# Patient Record
Sex: Male | Born: 1985 | Race: White | Hispanic: No | Marital: Single | Smoking: Never smoker
Health system: Southern US, Community
[De-identification: ages and names within clinical notes are randomized; demographics above are authoritative.]

## PROBLEM LIST (undated history)

## (undated) DIAGNOSIS — IMO0001 Reserved for inherently not codable concepts without codable children: Secondary | ICD-10-CM

## (undated) DIAGNOSIS — J45909 Unspecified asthma, uncomplicated: Secondary | ICD-10-CM

## (undated) HISTORY — DX: Unspecified asthma, uncomplicated: J45.909

## (undated) HISTORY — DX: Reserved for inherently not codable concepts without codable children: IMO0001

---
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
## 2005-03-17 ENCOUNTER — Ambulatory Visit (HOSPITAL_COMMUNITY): Admission: RE | Admit: 2005-03-17 | Discharge: 2005-03-17 | Payer: Self-pay | Admitting: Family Medicine

## 2012-12-03 ENCOUNTER — Encounter: Payer: Self-pay | Admitting: *Deleted

## 2012-12-07 ENCOUNTER — Encounter: Payer: Self-pay | Admitting: Nurse Practitioner

## 2012-12-07 ENCOUNTER — Ambulatory Visit (INDEPENDENT_AMBULATORY_CARE_PROVIDER_SITE_OTHER): Payer: BC Managed Care – PPO | Admitting: Nurse Practitioner

## 2012-12-07 VITALS — BP 122/80 | Temp 98.3°F | Wt 210.2 lb

## 2012-12-07 DIAGNOSIS — J209 Acute bronchitis, unspecified: Secondary | ICD-10-CM

## 2012-12-07 DIAGNOSIS — M722 Plantar fascial fibromatosis: Secondary | ICD-10-CM

## 2012-12-07 DIAGNOSIS — J069 Acute upper respiratory infection, unspecified: Secondary | ICD-10-CM

## 2012-12-07 MED ORDER — AZITHROMYCIN 250 MG PO TABS: ORAL_TABLET | ORAL | Status: DC

## 2012-12-07 MED ORDER — HYDROCODONE-HOMATROPINE 5-1.5 MG/5ML PO SYRP: 5.0000 mL | ORAL_SOLUTION | ORAL | Status: DC | PRN

## 2012-12-07 MED ORDER — PREDNISONE 20 MG PO TABS: ORAL_TABLET | ORAL | Status: DC

## 2012-12-07 MED ORDER — ALBUTEROL SULFATE HFA 108 (90 BASE) MCG/ACT IN AERS: 2.0000 | INHALATION_SPRAY | RESPIRATORY_TRACT | Status: DC | PRN

## 2012-12-07 NOTE — Patient Instructions (Signed)

## 2012-12-08 ENCOUNTER — Encounter: Payer: Self-pay | Admitting: Nurse Practitioner

## 2012-12-08 ENCOUNTER — Ambulatory Visit: Payer: Self-pay | Admitting: Nurse Practitioner

## 2012-12-08 DIAGNOSIS — J45909 Unspecified asthma, uncomplicated: Secondary | ICD-10-CM | POA: Insufficient documentation

## 2012-12-08 NOTE — Assessment & Plan Note (Signed)
Refill  Ventolin inhaler

## 2012-12-08 NOTE — Progress Notes (Signed)
Subjective:  Presents complaints of cough and congestion that began 2 weeks ago. Symptoms had improved until 2 days ago. Cough began worsening again producing color with his sputum. No fever. Headache and runny nose have improved. Began having wheezing yesterday. Using his inhaler at least to 3 times per day. Some relief of his wheezing. Asthma has been stable until this time. Sore throat. No ear pain. No vomiting diarrhea or abdominal pain. No acid reflux symptoms. Also complaints of pain in the right heel area going along the sole of the foot on the lateral side for the past 2 weeks. No specific history of injury. More intense when he first gets up in the morning and 21st and after sitting for while. Will occasionally radiate into the Achilles area.  Objective:   BP 122/80  Temp(Src) 98.3 F (36.8 C) (Oral)  Wt 210 lb 4 oz (95.369 kg) NAD. Alert, oriented. TMs mild clear effusion, no erythema. Pharynx erythematous with PND noted. Neck supple with mild soft slightly tender adenopathy. Lungs scattered expiratory crackles noted throughout lung fields. Faint intermittent expiratory wheeze noted upper lobe right side. No tachypnea. Normal color. Heart regular rate rhythm. Right foot mild tenderness noted with palpation of the heel area into the arch of the foot. Good ROM of the right ankle without tenderness. No tenderness with palpation of the Achilles tendon. Complaints of extreme tenderness when patient tries to do weightbearing after sitting during his visit.  Assessment: Acute upper respiratory infection  Acute bronchitis  Plantar fasciitis of right foot  plan: Given written and verbal information on plantar fasciitis. Prescription for prednisone 20 mg nine-day taper in case it is needed. Also Hycodan syrup as directed if needed. Z-Pak as directed. Refill Ventolin HFA inhaler. Call back if symptoms worsen or persist. Recommend heel injection if no improvement in pain.

## 2012-12-09 ENCOUNTER — Other Ambulatory Visit: Payer: Self-pay | Admitting: Nurse Practitioner

## 2013-03-27 ENCOUNTER — Encounter: Payer: Self-pay | Admitting: Family Medicine

## 2013-03-27 ENCOUNTER — Ambulatory Visit (INDEPENDENT_AMBULATORY_CARE_PROVIDER_SITE_OTHER): Payer: No Typology Code available for payment source | Admitting: Family Medicine

## 2013-03-27 VITALS — BP 134/88 | Temp 98.9°F | Ht 67.0 in | Wt 212.4 lb

## 2013-03-27 DIAGNOSIS — J029 Acute pharyngitis, unspecified: Secondary | ICD-10-CM

## 2013-03-27 MED ORDER — AZITHROMYCIN 250 MG PO TABS: ORAL_TABLET | ORAL | Status: DC

## 2013-03-27 NOTE — Progress Notes (Signed)
  Subjective:    Patient ID: Mike Harvey, male    DOB: [birth year], 27 y.o.   MRN: [id number]  Sore Throat  This is a new problem. The current episode started today. There has been no fever. Pertinent negatives include no congestion or coughing. Associated symptoms comments: Body aches . He has tried nothing for the symptoms.   Patient relates this is all hit him in the past couple days throat very severe feels warm at times.   Review of Systems  Constitutional: Positive for fatigue. Negative for fever and appetite change.  HENT: Positive for sore throat. Negative for congestion.   Respiratory: Negative for cough and chest tightness.   Cardiovascular: Negative for chest pain.       Objective:   Physical Exam  Vitals reviewed. Constitutional: He appears well-developed and well-nourished.  HENT:  Head: Normocephalic and atraumatic.  Cardiovascular: Normal rate, regular rhythm and normal heart sounds.   No murmur heard. Pulmonary/Chest: Effort normal and breath sounds normal.  Lymphadenopathy:    He has no cervical adenopathy.          Assessment & Plan:  Pharyngitis- rapid strep, no sign of any type of pneumonia asthma or sinusitis.

## 2013-03-28 LAB — STREP A DNA PROBE: GASP: NEGATIVE

## 2013-04-25 ENCOUNTER — Encounter: Payer: Self-pay | Admitting: Family Medicine

## 2013-04-25 ENCOUNTER — Ambulatory Visit (INDEPENDENT_AMBULATORY_CARE_PROVIDER_SITE_OTHER): Payer: No Typology Code available for payment source | Admitting: Family Medicine

## 2013-04-25 VITALS — BP 136/90 | Temp 98.3°F | Ht 67.0 in | Wt 212.0 lb

## 2013-04-25 DIAGNOSIS — L739 Follicular disorder, unspecified: Secondary | ICD-10-CM

## 2013-04-25 DIAGNOSIS — L738 Other specified follicular disorders: Secondary | ICD-10-CM

## 2013-04-25 MED ORDER — TRIAMCINOLONE ACETONIDE 0.1 % EX CREA: TOPICAL_CREAM | Freq: Three times a day (TID) | CUTANEOUS | Status: DC

## 2013-04-25 MED ORDER — DOXYCYCLINE HYCLATE 100 MG PO CAPS: 100.0000 mg | ORAL_CAPSULE | Freq: Two times a day (BID) | ORAL | Status: DC

## 2013-04-25 NOTE — Progress Notes (Signed)
  Subjective:    Patient ID: Mike Harvey, male    DOB: Jul 22, 1985, 27 y.o.   MRN: [id number]  HPIItchy Rash on stomach. Started 3 days ago. Used steroid cream with no relief. There were a few bumps it was somewhat tender no pus from it. No pustules. No blisters. They grouped right around the right abdomen. He denies any other particular problems. PMH benign Family history benign Doesn't smoke  Review of Systems See above no fevers    Objective:   Physical Exam There is no rash apparent except for in the right upper abdomen it is in a cluster but there is no sign of any blisters each one seems to be centered around hair follicle slightly tender to the pouch       Assessment & Plan:  More than likely folliculitis-doxycycline prescribed medication cream for itching also discussion held with patient about what to watch for in regards to shingles if he starts seen anything that looks like shingles he can notify us and we will start him on Valtrex he understands all of this

## 2013-05-16 ENCOUNTER — Ambulatory Visit: Payer: No Typology Code available for payment source | Admitting: Family Medicine

## 2013-05-25 ENCOUNTER — Ambulatory Visit (INDEPENDENT_AMBULATORY_CARE_PROVIDER_SITE_OTHER): Payer: No Typology Code available for payment source | Admitting: Nurse Practitioner

## 2013-05-25 ENCOUNTER — Encounter: Payer: Self-pay | Admitting: Nurse Practitioner

## 2013-05-25 VITALS — BP 148/92 | Ht 67.0 in | Wt 211.0 lb

## 2013-05-25 DIAGNOSIS — G5790 Unspecified mononeuropathy of unspecified lower limb: Secondary | ICD-10-CM

## 2013-05-25 DIAGNOSIS — M79609 Pain in unspecified limb: Secondary | ICD-10-CM

## 2013-05-25 DIAGNOSIS — G5792 Unspecified mononeuropathy of left lower limb: Secondary | ICD-10-CM

## 2013-05-25 DIAGNOSIS — M79672 Pain in left foot: Secondary | ICD-10-CM

## 2013-05-25 NOTE — Progress Notes (Signed)
Subjective:  Presents with pain in the left foot that is been going on for while. Over the past week has developed some numbness particularly along the inner aspect between the great and first toe. No history of injury. Plantar fasciitis symptoms have improved. No changes in his shoes. No knee pain. Had some calf tenderness several months ago but this has resolved.  Objective:   BP 148/92  Ht 5\' 7"  (1.702 m)  Wt 211 lb (95.709 kg)  BMI 33.04 kg/m2  NAD. Alert, oriented. Strong DP pulse left foot. Toes warm with good capillary refill. Callus/intractable plantar keratosis noted at the medial sesamoid left foot. Callus formation on the left foot is thicker than noted on the right. Slightly tender palpation. Pressure in this area produces tenderness and tingling between the great toe and first toe. Mild abduction of the toes on the left foot with walking.  Assessment:Left foot pain - Plan: Ambulatory referral to Podiatry  Neuropathy of foot, left - Plan: Ambulatory referral to Podiatry  Plan:  Anti-inflammatories as directed for discomfort. Will set up referral with podiatry as soon as possible. Call back sooner if needed.

## 2013-06-12 ENCOUNTER — Ambulatory Visit (INDEPENDENT_AMBULATORY_CARE_PROVIDER_SITE_OTHER): Payer: No Typology Code available for payment source

## 2013-06-12 ENCOUNTER — Ambulatory Visit: Payer: Self-pay

## 2013-06-12 ENCOUNTER — Encounter: Payer: Self-pay | Admitting: Podiatry

## 2013-06-12 ENCOUNTER — Ambulatory Visit (INDEPENDENT_AMBULATORY_CARE_PROVIDER_SITE_OTHER): Payer: No Typology Code available for payment source | Admitting: Podiatry

## 2013-06-12 VITALS — BP 141/103 | HR 105 | Resp 18 | Ht 67.0 in | Wt 212.0 lb

## 2013-06-12 DIAGNOSIS — M79671 Pain in right foot: Secondary | ICD-10-CM

## 2013-06-12 DIAGNOSIS — M722 Plantar fascial fibromatosis: Secondary | ICD-10-CM

## 2013-06-12 DIAGNOSIS — M79609 Pain in unspecified limb: Secondary | ICD-10-CM

## 2013-06-12 DIAGNOSIS — M216X9 Other acquired deformities of unspecified foot: Secondary | ICD-10-CM

## 2013-06-12 MED ORDER — MELOXICAM 15 MG PO TABS: 15.0000 mg | ORAL_TABLET | Freq: Every day | ORAL | Status: DC

## 2013-06-12 MED ORDER — METHYLPREDNISOLONE (PAK) 4 MG PO TABS: ORAL_TABLET | ORAL | Status: DC

## 2013-06-12 NOTE — Progress Notes (Signed)
   Subjective:    Patient ID: Mike Harvey, male    DOB: 1986-06-24, 27 y.o.   MRN: [id number]  HPI Comments: My feet been hurting me ,  mostly my left foot is tingly    N- tingling , sharp  L  BOTH FEET LEFT BOTTOM OF LEFT 1ST MET, BOTH HEELS  D PAIN HAS BEEN  SINCE SEPT, THE TINGLING STARTED IN November  O ALL OF A SUDDEN ,  C WORSE  A FIRST THING IN THE MORNING  T  STRETCHES AND ADVIL       Review of Systems  HENT: Positive for congestion.   Respiratory: Positive for cough.   All other systems reviewed and are negative.       Objective:   Physical Exam: I have reviewed his past medical history medications allergies surgeries social history family history and review of systems. Vital signs are stable he is alert and oriented x3. Pulses are strongly palpable bilateral with capillary fill time to digits one through 5 been immediate bilateral. Neurologic sensorium is intact per since once the monofilament bilateral. Deep tendon reflexes are intact bilateral muscle strength + over 5 dorsiflexors plantar flexors inverters and everters all intrinsic musculature is intact. Orthopedic evaluation demonstrates all joints distal to the ankle a full range of motion with exception of the first metatarsophalangeal joints bilaterally these are limited in dorsiflexion secondary to a long first metatarsal which is visible on radiograph. This is also resulting in reactive hyperkeratosis to the plantar aspect of the first metatarsophalangeal joint bilaterally. He also has pain on palpation to the medial continued tubercles of the bilateral heel.        Assessment & Plan:  Assessment: Plantar fasciitis bilateral. Plantar flexed elongated first metatarsals with hallux limitus first metatarsophalangeal joint bilateral.  Plan: We discussed the etiology pathology conservative versus surgical therapies at this point in time we are going to go ahead and injected his heels bilateral. We also put him in  a plantar fascial strapping bilaterally wrote her prescription for Medrol Dosepak to be followed by Mobic discussed appropriate shoe gear stretching exercises and ice therapy. Discussed the possible need for orthotics with a drop-down first metatarsophalangeal joint space.

## 2013-06-12 NOTE — Patient Instructions (Signed)
Plantar Fasciitis (Heel Spur Syndrome) with Rehab The plantar fascia is a fibrous, ligament-like, soft-tissue structure that spans the bottom of the foot. Plantar fasciitis is a condition that causes pain in the foot due to inflammation of the tissue. SYMPTOMS   Pain and tenderness on the underneath side of the foot.  Pain that worsens with standing or walking. CAUSES  Plantar fasciitis is caused by irritation and injury to the plantar fascia on the underneath side of the foot. Common mechanisms of injury include:  Direct trauma to bottom of the foot.  Damage to a small nerve that runs under the foot where the main fascia attaches to the heel bone.  Stress placed on the plantar fascia due to bone spurs. RISK INCREASES WITH:   Activities that place stress on the plantar fascia (running, jumping, pivoting, or cutting).  Poor strength and flexibility.  Improperly fitted shoes.  Tight calf muscles.  Flat feet.  Failure to warm-up properly before activity.  Obesity. PREVENTION  Warm up and stretch properly before activity.  Allow for adequate recovery between workouts.  Maintain physical fitness:  Strength, flexibility, and endurance.  Cardiovascular fitness.  Maintain a health body weight.  Avoid stress on the plantar fascia.  Wear properly fitted shoes, including arch supports for individuals who have flat feet. PROGNOSIS  If treated properly, then the symptoms of plantar fasciitis usually resolve without surgery. However, occasionally surgery is necessary. RELATED COMPLICATIONS   Recurrent symptoms that may result in a chronic condition.  Problems of the lower back that are caused by compensating for the injury, such as limping.  Pain or weakness of the foot during push-off following surgery.  Chronic inflammation, scarring, and partial or complete fascia tear, occurring more often from repeated injections. TREATMENT  Treatment initially involves the use of  ice and medication to help reduce pain and inflammation. The use of strengthening and stretching exercises may help reduce pain with activity, especially stretches of the Achilles tendon. These exercises may be performed at home or with a therapist. Your caregiver may recommend that you use heel cups of arch supports to help reduce stress on the plantar fascia. Occasionally, corticosteroid injections are given to reduce inflammation. If symptoms persist for greater than 6 months despite non-surgical (conservative), then surgery may be recommended.  MEDICATION   If pain medication is necessary, then nonsteroidal anti-inflammatory medications, such as aspirin and ibuprofen, or other minor pain relievers, such as acetaminophen, are often recommended.  Do not take pain medication within 7 days before surgery.  Prescription pain relievers may be given if deemed necessary by your caregiver. Use only as directed and only as much as you need.  Corticosteroid injections may be given by your caregiver. These injections should be reserved for the most serious cases, because they may only be given a certain number of times. HEAT AND COLD  Cold treatment (icing) relieves pain and reduces inflammation. Cold treatment should be applied for 10 to 15 minutes every 2 to 3 hours for inflammation and pain and immediately after any activity that aggravates your symptoms. Use ice packs or massage the area with a piece of ice (ice massage).  Heat treatment may be used prior to performing the stretching and strengthening activities prescribed by your caregiver, physical therapist, or athletic trainer. Use a heat pack or soak the injury in warm water. SEEK IMMEDIATE MEDICAL CARE IF:  Treatment seems to offer no benefit, or the condition worsens.  Any medications produce adverse side effects. EXERCISES RANGE   OF MOTION (ROM) AND STRETCHING EXERCISES - Plantar Fasciitis (Heel Spur Syndrome) These exercises may help you  when beginning to rehabilitate your injury. Your symptoms may resolve with or without further involvement from your physician, physical therapist or athletic trainer. While completing these exercises, remember:   Restoring tissue flexibility helps normal motion to return to the joints. This allows healthier, less painful movement and activity.  An effective stretch should be held for at least 30 seconds.  A stretch should never be painful. You should only feel a gentle lengthening or release in the stretched tissue. RANGE OF MOTION - Toe Extension, Flexion  Sit with your right / left leg crossed over your opposite knee.  Grasp your toes and gently pull them back toward the top of your foot. You should feel a stretch on the bottom of your toes and/or foot.  Hold this stretch for __________ seconds.  Now, gently pull your toes toward the bottom of your foot. You should feel a stretch on the top of your toes and or foot.  Hold this stretch for __________ seconds. Repeat __________ times. Complete this stretch __________ times per day.  RANGE OF MOTION - Ankle Dorsiflexion, Active Assisted  Remove shoes and sit on a chair that is preferably not on a carpeted surface.  Place right / left foot under knee. Extend your opposite leg for support.  Keeping your heel down, slide your right / left foot back toward the chair until you feel a stretch at your ankle or calf. If you do not feel a stretch, slide your bottom forward to the edge of the chair, while still keeping your heel down.  Hold this stretch for __________ seconds. Repeat __________ times. Complete this stretch __________ times per day.  STRETCH  Gastroc, Standing  Place hands on wall.  Extend right / left leg, keeping the front knee somewhat bent.  Slightly point your toes inward on your back foot.  Keeping your right / left heel on the floor and your knee straight, shift your weight toward the wall, not allowing your back to  arch.  You should feel a gentle stretch in the right / left calf. Hold this position for __________ seconds. Repeat __________ times. Complete this stretch __________ times per day. STRETCH  Soleus, Standing  Place hands on wall.  Extend right / left leg, keeping the other knee somewhat bent.  Slightly point your toes inward on your back foot.  Keep your right / left heel on the floor, bend your back knee, and slightly shift your weight over the back leg so that you feel a gentle stretch deep in your back calf.  Hold this position for __________ seconds. Repeat __________ times. Complete this stretch __________ times per day. STRETCH  Gastrocsoleus, Standing  Note: This exercise can place a lot of stress on your foot and ankle. Please complete this exercise only if specifically instructed by your caregiver.   Place the ball of your right / left foot on a step, keeping your other foot firmly on the same step.  Hold on to the wall or a rail for balance.  Slowly lift your other foot, allowing your body weight to press your heel down over the edge of the step.  You should feel a stretch in your right / left calf.  Hold this position for __________ seconds.  Repeat this exercise with a slight bend in your right / left knee. Repeat __________ times. Complete this stretch __________ times per day.    STRENGTHENING EXERCISES - Plantar Fasciitis (Heel Spur Syndrome)  These exercises may help you when beginning to rehabilitate your injury. They may resolve your symptoms with or without further involvement from your physician, physical therapist or athletic trainer. While completing these exercises, remember:   Muscles can gain both the endurance and the strength needed for everyday activities through controlled exercises.  Complete these exercises as instructed by your physician, physical therapist or athletic trainer. Progress the resistance and repetitions only as guided. STRENGTH - Towel  Curls  Sit in a chair positioned on a non-carpeted surface.  Place your foot on a towel, keeping your heel on the floor.  Pull the towel toward your heel by only curling your toes. Keep your heel on the floor.  If instructed by your physician, physical therapist or athletic trainer, add ____________________ at the end of the towel. Repeat __________ times. Complete this exercise __________ times per day. STRENGTH - Ankle Inversion  Secure one end of a rubber exercise band/tubing to a fixed object (table, pole). Loop the other end around your foot just before your toes.  Place your fists between your knees. This will focus your strengthening at your ankle.  Slowly, pull your big toe up and in, making sure the band/tubing is positioned to resist the entire motion.  Hold this position for __________ seconds.  Have your muscles resist the band/tubing as it slowly pulls your foot back to the starting position. Repeat __________ times. Complete this exercises __________ times per day.  Document Released: 06/22/2005 Document Revised: 09/14/2011 Document Reviewed: 10/04/2008 [employer] Patient Information 2014 [employer]. Plantar Fasciitis Plantar fasciitis is a common condition that causes foot pain. It is soreness (inflammation) of the band of tough fibrous tissue on the bottom of the foot that runs from the heel bone (calcaneus) to the ball of the foot. The cause of this soreness may be from excessive standing, poor fitting shoes, running on hard surfaces, being overweight, having an abnormal walk, or overuse (this is common in runners) of the painful foot or feet. It is also common in aerobic exercise dancers and ballet dancers. SYMPTOMS  Most people with plantar fasciitis complain of:  Severe pain in the morning on the bottom of their foot especially when taking the first steps out of bed. This pain recedes after a few minutes of walking.  Severe pain is experienced also during walking  following a long period of inactivity.  Pain is worse when walking barefoot or up stairs DIAGNOSIS   Your caregiver will diagnose this condition by examining and feeling your foot.  Special tests such as X-rays of your foot, are usually not needed. PREVENTION   Consult a sports medicine professional before beginning a new exercise program.  Walking programs offer a good workout. With walking there is a lower chance of overuse injuries common to runners. There is less impact and less jarring of the joints.  Begin all new exercise programs slowly. If problems or pain develop, decrease the amount of time or distance until you are at a comfortable level.  Wear good shoes and replace them regularly.  Stretch your foot and the heel cords at the back of the ankle (Achilles tendon) both before and after exercise.  Run or exercise on even surfaces that are not hard. For example, asphalt is better than pavement.  Do not run barefoot on hard surfaces.  If using a treadmill, vary the incline.  Do not continue to workout if you have foot or joint   problems. Seek professional help if they do not improve. HOME CARE INSTRUCTIONS   Avoid activities that cause you pain until you recover.  Use ice or cold packs on the problem or painful areas after working out.  Only take over-the-counter or prescription medicines for pain, discomfort, or fever as directed by your caregiver.  Soft shoe inserts or athletic shoes with air or gel sole cushions may be helpful.  If problems continue or become more severe, consult a sports medicine caregiver or your own health care provider. Cortisone is a potent anti-inflammatory medication that may be injected into the painful area. You can discuss this treatment with your caregiver. MAKE SURE YOU:   Understand these instructions.  Will watch your condition.  Will get help right away if you are not doing well or get worse. Document Released: 03/17/2001 Document  Revised: 09/14/2011 Document Reviewed: 05/16/2008 [employer] Patient Information 2014 [employer].  

## 2013-07-10 ENCOUNTER — Encounter: Payer: Self-pay | Admitting: Podiatry

## 2013-07-10 ENCOUNTER — Ambulatory Visit: Payer: No Typology Code available for payment source | Admitting: Podiatry

## 2013-07-10 VITALS — BP 149/94 | HR 64 | Resp 16 | Ht 68.0 in | Wt 210.0 lb

## 2013-07-10 DIAGNOSIS — M79609 Pain in unspecified limb: Secondary | ICD-10-CM

## 2013-07-10 DIAGNOSIS — M722 Plantar fascial fibromatosis: Secondary | ICD-10-CM

## 2013-07-10 NOTE — Progress Notes (Signed)
Mike Harvey presents today stating that his plantar fasciitis is approximately between 90% and 100% improved. He states that he had a stomach bug and was unable to take the Mobic for several weeks and just started back on her last night.  Objective: Vital signs are stable he is alert and oriented x3. Pulses are intact. No pain on palpation medial continued tubercles.  Assessment: Resolving plantar fasciitis bilateral.  Plan: Continue all conservative therapies for the next 6 weeks followup with me should there be any regression.

## 2013-08-21 ENCOUNTER — Ambulatory Visit: Payer: No Typology Code available for payment source | Admitting: Podiatry

## 2013-09-04 ENCOUNTER — Ambulatory Visit (INDEPENDENT_AMBULATORY_CARE_PROVIDER_SITE_OTHER): Payer: No Typology Code available for payment source | Admitting: Podiatry

## 2013-09-04 ENCOUNTER — Encounter: Payer: Self-pay | Admitting: Podiatry

## 2013-09-04 VITALS — BP 130/96 | HR 70 | Resp 16

## 2013-09-04 DIAGNOSIS — M79609 Pain in unspecified limb: Secondary | ICD-10-CM

## 2013-09-04 DIAGNOSIS — M79671 Pain in right foot: Secondary | ICD-10-CM

## 2013-09-04 DIAGNOSIS — M79672 Pain in left foot: Principal | ICD-10-CM

## 2013-09-04 NOTE — Progress Notes (Signed)
A lot better think maybe at 100%. He continues to take his anti-inflammatory and uses night splints daily.  Objective: Vital signs are stable he is alert and oriented x3. Pulses are strongly palpable bilateral there is no calf pain. He has no pain on medial compression of the point of maximal tenderness at the plantar fascial calcaneal insertion site bilateral foot.  Assessment: Well-healing plantar fasciitis bilateral foot.  Plan: Continue all conservative therapies and notify me should his pain started to regress pair

## 2014-06-22 ENCOUNTER — Ambulatory Visit (INDEPENDENT_AMBULATORY_CARE_PROVIDER_SITE_OTHER): Payer: No Typology Code available for payment source | Admitting: Family Medicine

## 2014-06-22 VITALS — Temp 98.4°F | Ht 68.0 in | Wt 222.0 lb

## 2014-06-22 DIAGNOSIS — B9689 Other specified bacterial agents as the cause of diseases classified elsewhere: Secondary | ICD-10-CM

## 2014-06-22 DIAGNOSIS — J019 Acute sinusitis, unspecified: Secondary | ICD-10-CM

## 2014-06-22 MED ORDER — LEVOFLOXACIN 500 MG PO TABS: 500.0000 mg | ORAL_TABLET | Freq: Every day | ORAL | Status: DC

## 2014-06-22 NOTE — Progress Notes (Signed)
   Subjective:    Patient ID: Mike Harvey, male    DOB: 1986/07/05, 28 y.o.   MRN: [id number]  Sinusitis This is a new problem. Episode onset: Monday. Maximum temperature: low grade. Associated symptoms include congestion, coughing and sinus pressure. Pertinent negatives include no ear pain. Past treatments include oral decongestants. The treatment provided mild relief.   Patient relates start off with sore throat viral like syndrome not feeling good them progressed in the sinus pressure pain discomfort.   Review of Systems  Constitutional: Negative for fever and activity change.  HENT: Positive for congestion, rhinorrhea and sinus pressure. Negative for ear pain.   Eyes: Negative for discharge.  Respiratory: Positive for cough. Negative for wheezing.   Cardiovascular: Negative for chest pain.       Objective:   Physical Exam  Constitutional: He appears well-developed.  HENT:  Head: Normocephalic.  Mouth/Throat: Oropharynx is clear and moist. No oropharyngeal exudate.  Neck: Normal range of motion.  Cardiovascular: Normal rate, regular rhythm and normal heart sounds.   No murmur heard. Pulmonary/Chest: Effort normal and breath sounds normal. He has no wheezes.  Lymphadenopathy:    He has no cervical adenopathy.  Neurological: He exhibits normal muscle tone.  Skin: Skin is warm and dry.  Nursing note and vitals reviewed.         Assessment & Plan:  Viral syndrome Probable acute sinusitis Antibiotics prescribed warning signs discuss

## 2014-08-13 ENCOUNTER — Other Ambulatory Visit: Payer: Self-pay | Admitting: Family Medicine

## 2014-08-13 MED ORDER — ALBUTEROL SULFATE HFA 108 (90 BASE) MCG/ACT IN AERS
2.0000 | INHALATION_SPRAY | RESPIRATORY_TRACT | Status: DC | PRN
Start: 2014-08-13 — End: 2016-03-27

## 2014-08-13 NOTE — Telephone Encounter (Signed)
Patient needs a prescription for albuterol inhaler [facility].

## 2014-08-13 NOTE — Addendum Note (Signed)
Addended by: Margaretha SheffieldBROWN, Annlee Glandon S on: 08/13/2014 11:13 AM   Modules accepted: Orders

## 2014-08-13 NOTE — Telephone Encounter (Signed)
Rx sent electronically to pharmacy. Patient notified. 

## 2014-08-21 ENCOUNTER — Encounter: Payer: Self-pay | Admitting: Family Medicine

## 2014-08-21 ENCOUNTER — Ambulatory Visit (INDEPENDENT_AMBULATORY_CARE_PROVIDER_SITE_OTHER): Payer: No Typology Code available for payment source | Admitting: Family Medicine

## 2014-08-21 VITALS — BP 128/84 | Temp 98.6°F | Ht 68.0 in | Wt 223.0 lb

## 2014-08-21 DIAGNOSIS — B9689 Other specified bacterial agents as the cause of diseases classified elsewhere: Secondary | ICD-10-CM

## 2014-08-21 DIAGNOSIS — J019 Acute sinusitis, unspecified: Secondary | ICD-10-CM

## 2014-08-21 MED ORDER — CEFPROZIL 500 MG PO TABS: 500.0000 mg | ORAL_TABLET | Freq: Two times a day (BID) | ORAL | Status: DC

## 2014-08-21 NOTE — Progress Notes (Signed)
   Subjective:    Patient ID: Mike Harvey, male    DOB: November 01, 1985, 29 y.o.   MRN: [id number]  Sinusitis This is a new problem. The current episode started in the past 7 days. There has been no fever. His pain is at a severity of 3/10. The pain is mild. Associated symptoms include congestion, coughing, a hoarse voice and sinus pressure. Pertinent negatives include no chills, ear pain, shortness of breath, sneezing, sore throat or swollen glands. Past treatments include oral decongestants and saline sprays. The treatment provided no relief.   Patient arrives with sinus sx since Thursday. Patient using OTC Advil cold and sinus.   Review of Systems  Constitutional: Negative for fever, chills and activity change.  HENT: Positive for congestion, hoarse voice, rhinorrhea and sinus pressure. Negative for ear pain, sneezing and sore throat.   Eyes: Negative for discharge.  Respiratory: Positive for cough. Negative for shortness of breath and wheezing.   Cardiovascular: Negative for chest pain.       Objective:   Physical Exam  Constitutional: He appears well-developed.  HENT:  Head: Normocephalic.  Mouth/Throat: Oropharynx is clear and moist. No oropharyngeal exudate.  Neck: Normal range of motion.  Cardiovascular: Normal rate, regular rhythm and normal heart sounds.   No murmur heard. Pulmonary/Chest: Effort normal and breath sounds normal. He has no wheezes.  Lymphadenopathy:    He has no cervical adenopathy.  Neurological: He exhibits normal muscle tone.  Skin: Skin is warm and dry.  Nursing note and vitals reviewed.   Patient was concerned that this might come back and get worse he states is now starting to feel somewhat better      Assessment & Plan:  Sinusitis antibiotics prescribed I told the patient a hold off at least couple days if he is gradually getting better no need for antibiotic warning signs discussed follow-up if problems

## 2014-08-21 NOTE — Patient Instructions (Signed)

## 2014-10-23 ENCOUNTER — Ambulatory Visit (INDEPENDENT_AMBULATORY_CARE_PROVIDER_SITE_OTHER): Payer: No Typology Code available for payment source | Admitting: Nurse Practitioner

## 2014-10-23 ENCOUNTER — Encounter: Payer: Self-pay | Admitting: Nurse Practitioner

## 2014-10-23 VITALS — BP 138/92 | Ht 67.25 in | Wt 218.4 lb

## 2014-10-23 DIAGNOSIS — IMO0001 Reserved for inherently not codable concepts without codable children: Secondary | ICD-10-CM

## 2014-10-23 DIAGNOSIS — Z0000 Encounter for general adult medical examination without abnormal findings: Secondary | ICD-10-CM | POA: Diagnosis not present

## 2014-10-23 DIAGNOSIS — R030 Elevated blood-pressure reading, without diagnosis of hypertension: Secondary | ICD-10-CM | POA: Diagnosis not present

## 2014-10-23 DIAGNOSIS — R5383 Other fatigue: Secondary | ICD-10-CM

## 2014-10-23 DIAGNOSIS — R0683 Snoring: Secondary | ICD-10-CM

## 2014-10-24 ENCOUNTER — Encounter: Payer: Self-pay | Admitting: Nurse Practitioner

## 2014-10-24 DIAGNOSIS — IMO0001 Reserved for inherently not codable concepts without codable children: Secondary | ICD-10-CM | POA: Insufficient documentation

## 2014-10-24 LAB — HEPATIC FUNCTION PANEL
ALBUMIN: 4.5 g/dL (ref 3.5–5.5)
ALT: 49 IU/L — AB (ref 0–44)
AST: 28 IU/L (ref 0–40)
Alkaline Phosphatase: 94 IU/L (ref 39–117)
Bilirubin Total: 0.7 mg/dL (ref 0.0–1.2)
Bilirubin, Direct: 0.15 mg/dL (ref 0.00–0.40)
Total Protein: 7.4 g/dL (ref 6.0–8.5)

## 2014-10-24 LAB — BASIC METABOLIC PANEL
BUN / CREAT RATIO: 11 (ref 8–19)
BUN: 12 mg/dL (ref 6–20)
CO2: 23 mmol/L (ref 18–29)
Calcium: 9.5 mg/dL (ref 8.7–10.2)
Chloride: 100 mmol/L (ref 97–108)
Creatinine, Ser: 1.07 mg/dL (ref 0.76–1.27)
GFR calc Af Amer: 109 mL/min/{1.73_m2} (ref >59)
GFR calc non Af Amer: 94 mL/min/{1.73_m2} (ref >59)
Glucose: 92 mg/dL (ref 65–99)
Potassium: 4.3 mmol/L (ref 3.5–5.2)
Sodium: 138 mmol/L (ref 134–144)

## 2014-10-24 LAB — LIPID PANEL
CHOL/HDL RATIO: 5.2 ratio — AB (ref 0.0–5.0)
Cholesterol, Total: 166 mg/dL (ref 100–199)
HDL: 32 mg/dL — ABNORMAL LOW (ref >39)
LDL Calculated: 105 mg/dL — ABNORMAL HIGH (ref 0–99)
Triglycerides: 144 mg/dL (ref 0–149)
VLDL Cholesterol Cal: 29 mg/dL (ref 5–40)

## 2014-10-24 NOTE — Progress Notes (Signed)
   Subjective:    Patient ID: Mike Harvey, male    DOB: Nov 08, 1985, 29 y.o.   MRN: [id number]  HPI presents for wellness physical. Usually healthy diet not as much lately; works as an Airline pilotaccountant. Does not do well with diet and exercise during tax season. Tries to avoid excessive soda and sweet tea. Regular vision and dental exams. Not sexually active. Defers STD testing. Plans to restart exercise program. Also has been told he has loud snoring and periods of apnea when sleeping. Has completed a [place] questionnaire which indicates possible sleep apnea.     Review of Systems  Constitutional: Positive for fatigue. Negative for fever, activity change and appetite change.  HENT: Negative for dental problem, ear pain, sinus pressure and sore throat.   Respiratory: Positive for apnea. Negative for cough, chest tightness, shortness of breath and wheezing.   Cardiovascular: Negative for chest pain.  Gastrointestinal: Negative for nausea, vomiting, abdominal pain, diarrhea, constipation and abdominal distention.  Genitourinary: Negative for dysuria, urgency, frequency, discharge, penile swelling, scrotal swelling, enuresis, difficulty urinating, genital sores, penile pain and testicular pain.  Psychiatric/Behavioral: Negative for behavioral problems and sleep disturbance.       Objective:   Physical Exam  Constitutional: He is oriented to person, place, and time. He appears well-developed and well-nourished.  HENT:  Right Ear: External ear normal.  Left Ear: External ear normal.  Mouth/Throat: Oropharynx is clear and moist.  Neck: Normal range of motion. Neck supple. No tracheal deviation present. No thyromegaly present.  Cardiovascular: Normal rate, regular rhythm and normal heart sounds.   Pulmonary/Chest: Effort normal and breath sounds normal.  Abdominal: Soft. He exhibits no distension. There is no tenderness.  Genitourinary:  Defers GU exam; denies any problems.   Musculoskeletal: He  exhibits no edema.  Lymphadenopathy:    He has no cervical adenopathy.  Neurological: He is alert and oriented to person, place, and time. He has normal reflexes.  Skin: Skin is warm and dry. No rash noted.  Psychiatric: He has a normal mood and affect. His behavior is normal. Thought content normal.  Vitals reviewed.         Assessment & Plan:  Routine general medical examination at a health care facility - Plan: Lipid panel  Other fatigue - Plan: Hepatic function panel, Basic metabolic panel  Snoring  White coat hypertension  Refer for sleep study. Recommend healthy diet, regular exercise and weight loss. Discussed safe sex issues.  Return in about 1 year (around 10/23/2015).

## 2014-11-05 ENCOUNTER — Ambulatory Visit (INDEPENDENT_AMBULATORY_CARE_PROVIDER_SITE_OTHER): Payer: No Typology Code available for payment source | Admitting: Nurse Practitioner

## 2014-11-05 ENCOUNTER — Ambulatory Visit: Payer: No Typology Code available for payment source | Admitting: Nurse Practitioner

## 2014-11-05 ENCOUNTER — Encounter: Payer: Self-pay | Admitting: Nurse Practitioner

## 2014-11-05 VITALS — BP 128/94 | Temp 99.1°F | Ht 67.25 in | Wt 221.8 lb

## 2014-11-05 DIAGNOSIS — J020 Streptococcal pharyngitis: Secondary | ICD-10-CM

## 2014-11-05 DIAGNOSIS — J069 Acute upper respiratory infection, unspecified: Secondary | ICD-10-CM

## 2014-11-05 LAB — POCT RAPID STREP A (OFFICE): Rapid Strep A Screen: POSITIVE — AB

## 2014-11-05 MED ORDER — AZITHROMYCIN 250 MG PO TABS: ORAL_TABLET | ORAL | Status: DC

## 2014-11-05 NOTE — Progress Notes (Signed)
Subjective:  Presents for c/o low grade fever, sore throat and body aches that began 2 days ago. Malaise. Slight cough and headache. Runny nose. Ear pressure. No vomiting, diarrhea or abdominal pain. Taking fluids well. Voiding nl.  Objective:   BP 128/94 mmHg  Temp(Src) 99.1 F (37.3 C) (Oral)  Ht 5' 7.25" (1.708 m)  Wt 221 lb 12.8 oz (100.608 kg)  BMI 34.49 kg/m2 NAD. Alert, oriented. TMs mild clear effusion. Pharynx moderate erythema. RST positive. Neck supple with mild anterior adenopathy. Lungs clear. Heart RRR.   Assessment: Acute streptococcal pharyngitis - Plan: POCT rapid strep A  Acute upper respiratory infection  Plan:  Meds ordered this encounter  Medications  . azithromycin (ZITHROMAX Z-PAK) 250 MG tablet    Sig: Take 2 tablets (500 mg) on  Day 1,  followed by 1 tablet (250 mg) once daily on Days 2 through 5.    Dispense:  6 each    Refill:  0    Order Specific Question:  Supervising Provider    Answer:  Merlyn AlbertLUKING, WILLIAM S [[id number]]   OTC meds as directed for symptomatic care. Call back in 2-3 d if no improvement sooner if worse.

## 2014-12-10 ENCOUNTER — Other Ambulatory Visit (HOSPITAL_COMMUNITY): Payer: Self-pay | Admitting: Respiratory Therapy

## 2014-12-10 DIAGNOSIS — S058X9A Other injuries of unspecified eye and orbit, initial encounter: Secondary | ICD-10-CM

## 2014-12-10 DIAGNOSIS — R0683 Snoring: Secondary | ICD-10-CM

## 2014-12-10 DIAGNOSIS — G4730 Sleep apnea, unspecified: Secondary | ICD-10-CM

## 2015-03-13 ENCOUNTER — Telehealth: Payer: Self-pay | Admitting: Family Medicine

## 2015-03-13 NOTE — Telephone Encounter (Signed)
Discussed with pt

## 2015-03-13 NOTE — Telephone Encounter (Signed)
That's fine to wait; just be careful about drowsiness especially when driving. He can call them directly when he wants to set up test. If they want to do home based sleep study it is easy. He just wears equipment while sleeping.

## 2015-03-13 NOTE — Telephone Encounter (Signed)
[facility]

## 2015-03-13 NOTE — Telephone Encounter (Signed)
Mike Harvey from [facility] called and said she has tried contacting patient since June to set up home sleep study, and he has not reached back to her.

## 2015-03-13 NOTE — Telephone Encounter (Signed)
Pt states he has been busy and he will call them he can to schedule. Wanted to ask carolyn was it ok to wait.

## 2015-07-24 ENCOUNTER — Ambulatory Visit (INDEPENDENT_AMBULATORY_CARE_PROVIDER_SITE_OTHER): Payer: Managed Care, Other (non HMO) | Admitting: Nurse Practitioner

## 2015-07-24 ENCOUNTER — Encounter: Payer: Self-pay | Admitting: Nurse Practitioner

## 2015-07-24 VITALS — BP 110/80 | Temp 98.9°F | Ht 67.25 in | Wt 226.2 lb

## 2015-07-24 DIAGNOSIS — J069 Acute upper respiratory infection, unspecified: Secondary | ICD-10-CM

## 2015-07-24 DIAGNOSIS — B9689 Other specified bacterial agents as the cause of diseases classified elsewhere: Secondary | ICD-10-CM

## 2015-07-24 MED ORDER — AZITHROMYCIN 250 MG PO TABS: ORAL_TABLET | ORAL | Status: DC

## 2015-07-24 NOTE — Progress Notes (Signed)
Subjective:  Presents for c/o cough and sore throat that began yesterday. No fever. Sore throat mainly in am. Worsening cough, producing dark green sputum. No wheezing. Has his inhaler if needed. No ear pain or headache.   Objective:   BP 110/80 mmHg  Temp(Src) 98.9 F (37.2 C) (Oral)  Ht 5' 7.25" (1.708 m)  Wt 226 lb 4 oz (102.626 kg)  BMI 35.18 kg/m2 NAD. Alert, oriented. TMs clear effusion. Pharynx non erythematous with PND noted. Neck supple with mild anterior adenopathy. Lungs clear. Heart RRR.   Assessment: Bacterial upper respiratory infection  Plan:  Meds ordered this encounter  Medications  . azithromycin (ZITHROMAX Z-PAK) 250 MG tablet    Sig: Take 2 tablets (500 mg) on  Day 1,  followed by 1 tablet (250 mg) once daily on Days 2 through 5.    Dispense:  6 each    Refill:  0    Order Specific Question:  Supervising Provider    Answer:  Merlyn Albert [[id number]]   OTC meds as directed. Return if symptoms worsen or fail to improve.

## 2016-02-24 ENCOUNTER — Encounter: Payer: Self-pay | Admitting: Family Medicine

## 2016-02-24 ENCOUNTER — Ambulatory Visit (INDEPENDENT_AMBULATORY_CARE_PROVIDER_SITE_OTHER): Payer: 59 | Admitting: Family Medicine

## 2016-02-24 VITALS — BP 116/80 | Temp 98.1°F | Ht 67.25 in | Wt 236.1 lb

## 2016-02-24 DIAGNOSIS — E162 Hypoglycemia, unspecified: Secondary | ICD-10-CM

## 2016-02-24 DIAGNOSIS — R5383 Other fatigue: Secondary | ICD-10-CM | POA: Diagnosis not present

## 2016-02-24 DIAGNOSIS — R739 Hyperglycemia, unspecified: Secondary | ICD-10-CM | POA: Diagnosis not present

## 2016-02-24 LAB — POCT GLUCOSE (DEVICE FOR HOME USE): POC GLUCOSE: 76 mg/dL (ref 70–99)

## 2016-02-24 NOTE — Progress Notes (Signed)
   Subjective:    Patient ID: Mike Harvey, male    DOB: [birth year], 30 y.o.   MRN: [id number]  HPI Patient is here today for low blood sugar readings while eating. Fatigue, weakness and sweating also noted. Onset 1 month ago.  He relates after eating her sometimes feel weak sweaty this will last for 3040 minutes then gradually get better his mom checked the sugar one time and it was in the 50s she also check a fasting sugar and it was in the mid 110s Results for orders placed or performed in visit on 02/24/16  POCT Glucose (Device for Home Use)  Result Value Ref Range   Glucose Fasting, POC  70 - 99 mg/dL   POC Glucose 76 70 - 99 mg/dl    Patient has no other concerns at this time.  There is a family history of diabetes  Review of Systems Patient denies chest tightness pressure pain shortness of breath    Objective:   Physical Exam  Lungs clear heart regular pulse normal BP good extremities no edema moderate obesity      Assessment & Plan:  Reactive hypoglycemia related to starch intake  Fasting hyperglycemia  The importance of complex carbohydrates regular physical activity weight loss discussed  Lab work ordered.

## 2016-02-26 LAB — HEMOGLOBIN A1C
Est. average glucose Bld gHb Est-mCnc: 117 mg/dL
Hgb A1c MFr Bld: 5.7 % — ABNORMAL HIGH (ref 4.8–5.6)

## 2016-02-26 LAB — BASIC METABOLIC PANEL
BUN/Creatinine Ratio: 10 (ref 9–20)
BUN: 11 mg/dL (ref 6–20)
CO2: 22 mmol/L (ref 18–29)
Calcium: 9.6 mg/dL (ref 8.7–10.2)
Chloride: 100 mmol/L (ref 96–106)
Creatinine, Ser: 1.09 mg/dL (ref 0.76–1.27)
GFR calc Af Amer: 105 mL/min/{1.73_m2} (ref >59)
GFR calc non Af Amer: 91 mL/min/{1.73_m2} (ref >59)
GLUCOSE: 101 mg/dL — AB (ref 65–99)
Potassium: 4.4 mmol/L (ref 3.5–5.2)
Sodium: 138 mmol/L (ref 134–144)

## 2016-02-26 LAB — CBC WITH DIFFERENTIAL/PLATELET
BASOS ABS: 0 10*3/uL (ref 0.0–0.2)
Basos: 0 %
EOS (ABSOLUTE): 0 10*3/uL (ref 0.0–0.4)
Eos: 1 %
Hematocrit: 48.9 % (ref 37.5–51.0)
Hemoglobin: 16.8 g/dL (ref 12.6–17.7)
Immature Grans (Abs): 0 10*3/uL (ref 0.0–0.1)
Immature Granulocytes: 1 %
LYMPHS: 35 %
Lymphocytes Absolute: 2.2 10*3/uL (ref 0.7–3.1)
MCH: 32 pg (ref 26.6–33.0)
MCHC: 34.4 g/dL (ref 31.5–35.7)
MCV: 93 fL (ref 79–97)
Monocytes Absolute: 0.7 10*3/uL (ref 0.1–0.9)
Monocytes: 12 %
Neutrophils Absolute: 3.2 10*3/uL (ref 1.4–7.0)
Neutrophils: 51 %
PLATELETS: 317 10*3/uL (ref 150–379)
RBC: 5.25 x10E6/uL (ref 4.14–5.80)
RDW: 14.3 % (ref 12.3–15.4)
WBC: 6.2 10*3/uL (ref 3.4–10.8)

## 2016-02-26 LAB — LIPID PANEL
Chol/HDL Ratio: 5.3 ratio units — ABNORMAL HIGH (ref 0.0–5.0)
Cholesterol, Total: 171 mg/dL (ref 100–199)
HDL: 32 mg/dL — ABNORMAL LOW (ref >39)
LDL Calculated: 98 mg/dL (ref 0–99)
Triglycerides: 204 mg/dL — ABNORMAL HIGH (ref 0–149)
VLDL CHOLESTEROL CAL: 41 mg/dL — AB (ref 5–40)

## 2016-03-27 ENCOUNTER — Ambulatory Visit (INDEPENDENT_AMBULATORY_CARE_PROVIDER_SITE_OTHER): Payer: 59 | Admitting: Nurse Practitioner

## 2016-03-27 ENCOUNTER — Encounter: Payer: Self-pay | Admitting: Nurse Practitioner

## 2016-03-27 VITALS — BP 138/90 | Ht 68.0 in | Wt 234.0 lb

## 2016-03-27 DIAGNOSIS — Z0000 Encounter for general adult medical examination without abnormal findings: Secondary | ICD-10-CM | POA: Diagnosis not present

## 2016-03-27 NOTE — Patient Instructions (Signed)
Massage therapy Chiropractor OTC TENS unit [facility](Icy Hot Smart Relief) Lidocaine patches Biofreeze exercises

## 2016-03-28 ENCOUNTER — Encounter: Payer: Self-pay | Admitting: Nurse Practitioner

## 2016-03-28 NOTE — Progress Notes (Signed)
   Subjective:    Patient ID: Mike Harvey, male    DOB: 09-Apr-1986, 30 y.o.   MRN: [id number]  HPI presents for his wellness exam. Started a regular walking program this week. Has a desk job working in Audiological scientistaccounting. Defers STD testing. Regular vision and dental exams.     Review of Systems  Constitutional: Negative for activity change, appetite change and fatigue.  HENT: Negative for dental problem, ear pain, sinus pressure and sore throat.   Respiratory: Negative for cough, chest tightness, shortness of breath and wheezing.   Cardiovascular: Negative for chest pain.  Gastrointestinal: Negative for abdominal distention, abdominal pain, constipation, diarrhea, nausea and vomiting.  Genitourinary: Negative for difficulty urinating, discharge, dysuria, enuresis, frequency, genital sores, penile pain, penile swelling, scrotal swelling, testicular pain and urgency.       Objective:   Physical Exam  Constitutional: He is oriented to person, place, and time. He appears well-developed and well-nourished.  HENT:  Right Ear: External ear normal.  Left Ear: External ear normal.  Mouth/Throat: Oropharynx is clear and moist.  Neck: Normal range of motion. Neck supple. No tracheal deviation present. No thyromegaly present.  Cardiovascular: Normal rate, regular rhythm and normal heart sounds.   Pulmonary/Chest: Effort normal and breath sounds normal.  Abdominal: Soft. He exhibits no distension. There is no tenderness.  Genitourinary:  Genitourinary Comments: Defers GU exam; denies any problems.   Lymphadenopathy:    He has no cervical adenopathy.  Neurological: He is alert and oriented to person, place, and time.  Skin: Skin is warm and dry. No rash noted.  Psychiatric: He has a normal mood and affect. His behavior is normal. Thought content normal.  Vitals reviewed. reviewed labs with patient from 02/25/16        Assessment & Plan:  Routine general medical examination at a health care  facility  Encouraged regular activity, weight loss and healthy diet.  Return in about 1 year (around 03/27/2017) for physical.

## 2016-06-02 ENCOUNTER — Encounter: Payer: Self-pay | Admitting: Family Medicine

## 2016-06-02 ENCOUNTER — Ambulatory Visit (INDEPENDENT_AMBULATORY_CARE_PROVIDER_SITE_OTHER): Payer: 59 | Admitting: Family Medicine

## 2016-06-02 VITALS — BP 114/80 | Temp 98.5°F | Ht 68.0 in | Wt 231.5 lb

## 2016-06-02 DIAGNOSIS — B9689 Other specified bacterial agents as the cause of diseases classified elsewhere: Secondary | ICD-10-CM

## 2016-06-02 DIAGNOSIS — J019 Acute sinusitis, unspecified: Secondary | ICD-10-CM | POA: Diagnosis not present

## 2016-06-02 MED ORDER — AMOXICILLIN-POT CLAVULANATE 875-125 MG PO TABS: 1.0000 | ORAL_TABLET | Freq: Two times a day (BID) | ORAL | 0 refills | Status: AC

## 2016-06-02 NOTE — Progress Notes (Signed)
   Subjective:    Patient ID: Mike Harvey, male    DOB: [birth year], 30 y.o.   MRN: [id number]  Fever   This is a new problem. The current episode started in the past 7 days. The problem occurs intermittently. The problem has been unchanged. The maximum temperature noted was 100 to 100.9 F. Associated symptoms include coughing and a sore throat. He has tried NSAIDs for the symptoms. The treatment provided mild relief.   Patient stsat night throa t tstarted hurting bad, could not swall then had fever  Coughing productive started yest, also voice hoarse   ates that he has no other concerns at this time.   Some achiness nmild, some h a this morn   advil as neded, took mucinex last nigh t  Review of Systems  Constitutional: Positive for fever.  HENT: Positive for sore throat.   Respiratory: Positive for cough.        Objective:   Physical Exam  Alert, mild malaise. Hydration good Vitals stable. frontal/ maxillary tenderness evident positive nasal congestion. pharynx normal neck supple  lungs clear/no crackles or wheezes. heart regular in rhythm       Assessment & Plan:  Impression rhinosinusitis likely post viral, discussed with patient. plan antibiotics prescribed. Questions answered. Symptomatic care discussed. warning signs discussed. WSL

## 2016-07-03 ENCOUNTER — Encounter: Payer: Self-pay | Admitting: Nurse Practitioner

## 2016-07-03 ENCOUNTER — Encounter: Payer: Self-pay | Admitting: Family Medicine

## 2016-07-03 ENCOUNTER — Ambulatory Visit (INDEPENDENT_AMBULATORY_CARE_PROVIDER_SITE_OTHER): Payer: 59 | Admitting: Nurse Practitioner

## 2016-07-03 VITALS — BP 138/92 | Temp 98.2°F | Wt 233.2 lb

## 2016-07-03 DIAGNOSIS — J208 Acute bronchitis due to other specified organisms: Secondary | ICD-10-CM

## 2016-07-03 DIAGNOSIS — B9689 Other specified bacterial agents as the cause of diseases classified elsewhere: Secondary | ICD-10-CM

## 2016-07-03 DIAGNOSIS — J329 Chronic sinusitis, unspecified: Secondary | ICD-10-CM | POA: Diagnosis not present

## 2016-07-03 DIAGNOSIS — J310 Chronic rhinitis: Secondary | ICD-10-CM

## 2016-07-03 MED ORDER — LEVOFLOXACIN 500 MG PO TABS: 500.0000 mg | ORAL_TABLET | Freq: Every day | ORAL | 0 refills | Status: DC

## 2016-07-03 MED ORDER — PREDNISONE 20 MG PO TABS: ORAL_TABLET | ORAL | 0 refills | Status: DC

## 2016-07-03 MED ORDER — ALBUTEROL SULFATE HFA 108 (90 BASE) MCG/ACT IN AERS: 2.0000 | INHALATION_SPRAY | RESPIRATORY_TRACT | 0 refills | Status: DC | PRN

## 2016-07-03 MED ORDER — HYDROCODONE-HOMATROPINE 5-1.5 MG/5ML PO SYRP: 5.0000 mL | ORAL_SOLUTION | ORAL | 0 refills | Status: DC | PRN

## 2016-07-03 NOTE — Progress Notes (Signed)
Subjective:  Presents complaints of cough and congestion for the past 5 days. Facial area headache. Runny nose. Worsening frequent cough producing green mucus. Runny nose. Minimal wheezing, has not used an inhaler, needs a refill. No sore throat or ear pain. No fever. Completed a course of Augmentin recently for sinus.  Objective:   BP (!) 138/92   Temp 98.2 F (36.8 C) (Oral)   Wt 233 lb 3.2 oz (105.8 kg)   BMI 35.46 kg/m  NAD. Alert, oriented. Mild shortness of breath with talking. TMs clear effusion, no erythema. Pharynx moderate erythema with PND noted. Neck supple with mild soft anterior adenopathy. Lungs good airflow with coarse expiratory crackles and occasional faint expiratory wheeze. No indication for nebulizer treatment at this time and patient defers. No tachypnea. Heart regular rate rhythm.  Assessment: Rhinosinusitis  Acute bacterial bronchitis   Plan:  Meds ordered this encounter  Medications  . albuterol (PROVENTIL HFA;VENTOLIN HFA) 108 (90 Base) MCG/ACT inhaler    Sig: Inhale 2 puffs into the lungs every 4 (four) hours as needed.    Dispense:  1 Inhaler    Refill:  0    Order Specific Question:   Supervising Provider    Answer:   Merlyn AlbertLUKING, WILLIAM S [[id number]]  . levofloxacin (LEVAQUIN) 500 MG tablet    Sig: Take 1 tablet (500 mg total) by mouth daily.    Dispense:  10 tablet    Refill:  0    Order Specific Question:   Supervising Provider    Answer:   Merlyn AlbertLUKING, WILLIAM S [[id number]]  . predniSONE (DELTASONE) 20 MG tablet    Sig: 3 po qd x 3 d then 2 po qd x 3 d then 1 po qd x 2 d    Dispense:  17 tablet    Refill:  0    Order Specific Question:   Supervising Provider    Answer:   Merlyn AlbertLUKING, WILLIAM S [[id number]]  . HYDROcodone-homatropine (HYCODAN) 5-1.5 MG/5ML syrup    Sig: Take 5 mLs by mouth every 4 (four) hours as needed.    Dispense:  90 mL    Refill:  0    Order Specific Question:   Supervising Provider    Answer:   Merlyn AlbertLUKING, WILLIAM S [[id number]]   Warning signs reviewed.  Call back if symptoms worsen or persist.

## 2016-08-15 DIAGNOSIS — R509 Fever, unspecified: Secondary | ICD-10-CM | POA: Diagnosis not present

## 2016-08-15 DIAGNOSIS — J069 Acute upper respiratory infection, unspecified: Secondary | ICD-10-CM | POA: Diagnosis not present

## 2016-08-15 DIAGNOSIS — R6889 Other general symptoms and signs: Secondary | ICD-10-CM | POA: Diagnosis not present

## 2016-08-17 ENCOUNTER — Ambulatory Visit (INDEPENDENT_AMBULATORY_CARE_PROVIDER_SITE_OTHER): Payer: 59 | Admitting: Nurse Practitioner

## 2016-08-17 ENCOUNTER — Encounter: Payer: Self-pay | Admitting: Nurse Practitioner

## 2016-08-17 VITALS — BP 120/82 | Temp 98.5°F | Ht 68.0 in | Wt 232.2 lb

## 2016-08-17 DIAGNOSIS — J111 Influenza due to unidentified influenza virus with other respiratory manifestations: Secondary | ICD-10-CM | POA: Diagnosis not present

## 2016-08-17 DIAGNOSIS — J4521 Mild intermittent asthma with (acute) exacerbation: Secondary | ICD-10-CM | POA: Diagnosis not present

## 2016-08-17 MED ORDER — PREDNISONE 20 MG PO TABS: ORAL_TABLET | ORAL | 0 refills | Status: DC

## 2016-08-17 NOTE — Progress Notes (Signed)
Subjective:  Presents for recheck after being diagnosed with influenza 3 days ago. Currently on Tamiflu. Now having low grade fever. Headache better. No sore throat or ear pain. No V/D or abd pain. Runny nose, head congestion with frequent cough. Wheezing began last night. Has not used inhaler. Started on Prednisone 60 mg last night that he had at home.    Objective:   BP 120/82   Temp 98.5 F (36.9 C) (Oral)   Ht 5\' 8"  (1.727 m)   Wt 232 lb 4 oz (105.3 kg)   BMI 35.31 kg/m  NAD. Alert, oriented. TMs mild clear effusion. Pharynx mild erythema with PND noted. Neck supple with mild adenopathy. Lungs initially expiratory wheezes throughout lung fields. Defers neb treatment due to side effects. Used his albuterol inhaler. Wheezing greatly improved with good air flow. No tachynpea. O2 sat 98%. Heart RRR.  Assessment:   Problem List Items Addressed This Visit      Respiratory   Asthma (Chronic)   Relevant Medications   predniSONE (DELTASONE) 20 MG tablet    Other Visit Diagnoses    Influenza    -  Primary        Plan:   Meds ordered this encounter  Medications  . predniSONE (DELTASONE) 20 MG tablet    Sig: 3 po qd x 3 d then 2 po qd x 3 d then 1 po qd x 2 d    Dispense:  17 tablet    Refill:  0    Order Specific Question:   Supervising Provider    Answer:   Merlyn AlbertLUKING, WILLIAM S [[id number]]   Complete Tamiflu as directed. Reviewed symptomatic care and warning signs. Call back by end of the week if no improvement, sooner if worse.

## 2017-03-10 ENCOUNTER — Ambulatory Visit (INDEPENDENT_AMBULATORY_CARE_PROVIDER_SITE_OTHER): Payer: 59 | Admitting: Nurse Practitioner

## 2017-03-10 ENCOUNTER — Encounter: Payer: Self-pay | Admitting: Nurse Practitioner

## 2017-03-10 VITALS — BP 136/90 | Ht 68.0 in | Wt 232.2 lb

## 2017-03-10 DIAGNOSIS — G629 Polyneuropathy, unspecified: Secondary | ICD-10-CM | POA: Diagnosis not present

## 2017-03-10 DIAGNOSIS — E162 Hypoglycemia, unspecified: Secondary | ICD-10-CM | POA: Diagnosis not present

## 2017-03-10 DIAGNOSIS — G608 Other hereditary and idiopathic neuropathies: Secondary | ICD-10-CM

## 2017-03-10 LAB — POCT GLUCOSE (DEVICE FOR HOME USE): POC Glucose: 132 mg/dl — AB (ref 70–99)

## 2017-03-11 ENCOUNTER — Encounter: Payer: Self-pay | Admitting: Nurse Practitioner

## 2017-03-11 LAB — FERRITIN: Ferritin: 402 ng/mL — ABNORMAL HIGH (ref 30–400)

## 2017-03-11 LAB — VITAMIN B12: Vitamin B-12: 323 pg/mL (ref 232–1245)

## 2017-03-11 LAB — HEMOGLOBIN A1C
ESTIMATED AVERAGE GLUCOSE: 114 mg/dL
HEMOGLOBIN A1C: 5.6 % (ref 4.8–5.6)

## 2017-03-11 NOTE — Progress Notes (Signed)
Subjective:  Presents for c/o tingling on both feet to mid foot occurring off/on. Has been working on weight loss. Decreased portion sizes. No polydipsia, polyuria or polyphagia. Very limited exercise. Desk job. Has been trying to eat out less. Seems to have symptoms when he eats candy. No other neuropathic symptoms.   Objective:   BP 136/90   Ht 5\' 8"  (1.727 m)   Wt 232 lb 4 oz (105.3 kg)   BMI 35.31 kg/m  NAD. Alert, oriented. Lungs clear. Heart RRR.  Diabetic Foot Exam - Simple   Simple Foot Form Diabetic Foot exam was performed with the following findings:  Yes 03/10/2017  2:20 PM  Visual Inspection No deformities, no ulcerations, no other skin breakdown bilaterally:  Yes Sensation Testing Intact to touch and monofilament testing bilaterally:  Yes Pulse Check Posterior Tibialis and Dorsalis pulse intact bilaterally:  Yes Comments Toes warm with normal cap refill.    non fasting glucose 132. Hgb A1C 5.6   Assessment:  Low blood sugar - Plan: POCT Glucose (Device for Home Use)  Peripheral sensory neuropathy - Plan: Hemoglobin A1C, Ferritin, Vitamin B12    Plan:  Labs pending.  Encouraged weight loss efforts and increasing activity. Further follow up based on labs.

## 2017-04-05 ENCOUNTER — Encounter: Payer: 59 | Admitting: Nurse Practitioner

## 2017-05-31 ENCOUNTER — Ambulatory Visit (INDEPENDENT_AMBULATORY_CARE_PROVIDER_SITE_OTHER): Payer: 59 | Admitting: Nurse Practitioner

## 2017-05-31 ENCOUNTER — Encounter: Payer: Self-pay | Admitting: Nurse Practitioner

## 2017-05-31 VITALS — BP 140/90 | Ht 67.0 in | Wt 227.0 lb

## 2017-05-31 DIAGNOSIS — Z0000 Encounter for general adult medical examination without abnormal findings: Secondary | ICD-10-CM | POA: Diagnosis not present

## 2017-06-01 DIAGNOSIS — Z0000 Encounter for general adult medical examination without abnormal findings: Secondary | ICD-10-CM | POA: Diagnosis not present

## 2017-06-02 ENCOUNTER — Encounter: Payer: Self-pay | Admitting: Nurse Practitioner

## 2017-06-02 LAB — BASIC METABOLIC PANEL
BUN / CREAT RATIO: 16 (ref 9–20)
BUN: 18 mg/dL (ref 6–20)
CO2: 23 mmol/L (ref 20–29)
CREATININE: 1.14 mg/dL (ref 0.76–1.27)
Calcium: 9.6 mg/dL (ref 8.7–10.2)
Chloride: 103 mmol/L (ref 96–106)
GFR, EST AFRICAN AMERICAN: 99 mL/min/{1.73_m2} (ref >59)
GFR, EST NON AFRICAN AMERICAN: 85 mL/min/{1.73_m2} (ref >59)
GLUCOSE: 97 mg/dL (ref 65–99)
Potassium: 4.6 mmol/L (ref 3.5–5.2)
Sodium: 140 mmol/L (ref 134–144)

## 2017-06-02 LAB — LIPID PANEL
Chol/HDL Ratio: 5.6 ratio — ABNORMAL HIGH (ref 0.0–5.0)
Cholesterol, Total: 178 mg/dL (ref 100–199)
HDL: 32 mg/dL — AB (ref >39)
LDL Calculated: 114 mg/dL — ABNORMAL HIGH (ref 0–99)
TRIGLYCERIDES: 159 mg/dL — AB (ref 0–149)
VLDL CHOLESTEROL CAL: 32 mg/dL (ref 5–40)

## 2017-06-02 LAB — HEPATIC FUNCTION PANEL
ALBUMIN: 4.6 g/dL (ref 3.5–5.5)
ALK PHOS: 106 IU/L (ref 39–117)
ALT: 58 IU/L — ABNORMAL HIGH (ref 0–44)
AST: 28 IU/L (ref 0–40)
BILIRUBIN TOTAL: 0.5 mg/dL (ref 0.0–1.2)
BILIRUBIN, DIRECT: 0.12 mg/dL (ref 0.00–0.40)
TOTAL PROTEIN: 7.5 g/dL (ref 6.0–8.5)

## 2017-06-02 NOTE — Progress Notes (Signed)
   Subjective:    Patient ID: Deirdre PriestJoe W Cozby, male    DOB: 1985/09/23, 31 y.o.   MRN: [id number]  HPI presents for his wellness exam.  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  Regular vision and dental exams.  No current sexual partner, but defers STD testing.  Trying to eat a healthy diet.    Review of Systems  Constitutional: Negative for activity change, appetite change and fatigue.  HENT: Negative for dental problem, ear pain, sinus pressure and sore throat.   Respiratory: Negative for cough, chest tightness, shortness of breath and wheezing.   Cardiovascular: Negative for chest pain.  Gastrointestinal: Negative for abdominal distention, abdominal pain, constipation, diarrhea, nausea and vomiting.  Genitourinary: Negative for difficulty urinating, discharge, dysuria, enuresis, frequency, genital sores, penile pain, penile swelling, scrotal swelling, testicular pain and urgency.       Objective:   Physical Exam  Constitutional: He is oriented to person, place, and time. He appears well-developed and well-nourished.  HENT:  Right Ear: External ear normal.  Left Ear: External ear normal.  Mouth/Throat: Oropharynx is clear and moist.  Neck: Normal range of motion. Neck supple. No tracheal deviation present. No thyromegaly present.  Cardiovascular: Normal rate, regular rhythm and normal heart sounds.  Pulmonary/Chest: Effort normal and breath sounds normal.  Abdominal: Soft. He exhibits no distension. There is no tenderness.  Lymphadenopathy:    He has no cervical adenopathy.  Neurological: He is alert and oriented to person, place, and time.  Skin: Skin is warm and dry. No rash noted.  Psychiatric: He has a normal mood and affect. His behavior is normal. Thought content normal.  Vitals reviewed.         Assessment & Plan:  Annual physical exam - Plan: Lipid panel, Basic metabolic panel, Hepatic function panel  Recommend increased activity, consider standing desk.  Lab work  pending.  Defers STD screening including HIV screening. Return in about 1 year (around 05/31/2018) for physical.

## 2017-06-10 ENCOUNTER — Encounter: Payer: Self-pay | Admitting: Nurse Practitioner

## 2017-06-25 ENCOUNTER — Encounter: Payer: Self-pay | Admitting: Family Medicine

## 2017-06-25 ENCOUNTER — Ambulatory Visit (INDEPENDENT_AMBULATORY_CARE_PROVIDER_SITE_OTHER): Payer: 59 | Admitting: Family Medicine

## 2017-06-25 VITALS — BP 118/78 | Temp 98.9°F | Ht 67.0 in | Wt 227.0 lb

## 2017-06-25 DIAGNOSIS — R300 Dysuria: Secondary | ICD-10-CM | POA: Diagnosis not present

## 2017-06-25 DIAGNOSIS — N410 Acute prostatitis: Secondary | ICD-10-CM

## 2017-06-25 LAB — POCT URINALYSIS DIPSTICK
PROTEIN UA: 100
pH, UA: 6 (ref 5.0–8.0)

## 2017-06-25 MED ORDER — CIPROFLOXACIN HCL 500 MG PO TABS: 500.0000 mg | ORAL_TABLET | Freq: Two times a day (BID) | ORAL | 0 refills | Status: DC

## 2017-06-25 NOTE — Patient Instructions (Signed)
Prostatitis Prostatitis is swelling or inflammation of the prostate gland. The prostate is a walnut-sized gland that is involved in the production of semen. It is located below a man's bladder, in front of the rectum. There are four types of prostatitis:  Chronic nonbacterial prostatitis. This is the most common type of prostatitis. It may be associated with a viral infection or autoimmune disorder.  Acute bacterial prostatitis. This is the least common type of prostatitis. It starts quickly and is usually associated with a bladder infection, high fever, and shaking chills. It can occur at any age.  Chronic bacterial prostatitis. This type usually results from acute bacterial prostatitis that happens repeatedly (is recurrent) or has not been treated properly. It can occur in men of any age but is most common among middle-aged men whose prostate has begun to get larger. The symptoms are not as severe as symptoms caused by acute bacterial prostatitis.  Prostatodynia or chronic pelvic pain syndrome (CPPS). This type is also called pelvic floor disorder. It is associated with increased muscular tone in the pelvis surrounding the prostate. What are the causes? Bacterial prostatitis is caused by infection from bacteria. Chronic nonbacterial prostatitis may be caused by:  Urinary tract infections (UTIs).  Nerve damage.  A response by the body's disease-fighting system (autoimmune response).  Chemicals in the urine. The causes of the other types of prostatitis are usually not known. What are the signs or symptoms? Symptoms of this condition vary depending upon the type of prostatitis. If you have acute bacterial prostatitis, you may experience:  Urinary symptoms, such as:  Painful urination.  Burning during urination.  Frequent and sudden urges to urinate.  Inability to start urinating.  A weak or interrupted stream of urine.  Vomiting.  Nausea.  Fever.  Chills.  Inability to  empty the bladder completely.  Pain in the:  Muscles or joints.  Lower back.  Lower abdomen. If you have any of the other types of prostatitis, you may experience:  Urinary symptoms, such as:  Sudden urges to urinate.  Frequent urination.  Difficulty starting urination.  Weak urine stream.  Dribbling after urination.  Discharge from the urethra. The urethra is a tube that opens at the end of the penis.  Pain in the:  Testicles.  Penis or tip of the penis.  Rectum.  Area in front of the rectum and below the scrotum (perineum).  Problems with sexual function.  Painful ejaculation.  Bloody semen. How is this diagnosed? This condition may be diagnosed based on:  A physical and medical exam.  Your symptoms.  A urine test to check for bacteria.  An exam in which a health care provider uses a finger to feel the prostate (digital rectal exam).  A test of a sample of semen.  Blood tests.  Ultrasound.  Removal of prostate tissue to be examined under a microscope (biopsy).  Tests to check how your body handles urine (urodynamic tests).  A test to look inside your bladder or urethra (cystoscopy). How is this treated? Treatment for this condition depends on the type of prostatitis. Treatment may involve:  Medicines to relieve pain or inflammation.  Medicines to help relax your muscles.  Physical therapy.  Heat therapy.  Techniques to help you control certain body functions (biofeedback).  Relaxation exercises.  Antibiotic medicine, if your condition is caused by bacteria.  Warm water baths (sitz baths). Sitz baths help with relaxing your pelvic floor muscles, which helps to relieve pressure on the prostate. Follow   these instructions at home:  Take over-the-counter and prescription medicines only as told by your health care provider.  If you were prescribed an antibiotic, take it as told by your health care provider. Do not stop taking the  antibiotic even if you start to feel better.  If physical therapy, biofeedback, or relaxation exercises were prescribed, do exercises as instructed.  Take sitz baths as directed by your health care provider. For a sitz bath, sit in warm water that is deep enough to cover your hips and buttocks.  Keep all follow-up visits as told by your health care provider. This is important. Contact a health care provider if:  Your symptoms get worse.  You have a fever. Get help right away if:  You have chills.  You feel nauseous.  You vomit.  You feel light-headed or feel like you are going to faint.  You are unable to urinate.  You have blood or blood clots in your urine. This information is not intended to replace advice given to you by your health care provider. Make sure you discuss any questions you have with your health care provider. Document Released: 06/19/2000 Document Revised: 03/12/2016 Document Reviewed: 03/12/2016 [employer] Interactive Patient Education  2017 [employer]  

## 2017-06-25 NOTE — Progress Notes (Signed)
   Subjective:    Patient ID: Mike Harvey, male    DOB: 1986-04-09, 31 y.o.   MRN: [id number]  Dysuria   This is a new problem. The current episode started today. He has tried nothing for the symptoms.   Results for orders placed or performed in visit on 06/25/17  POCT urinalysis dipstick  Result Value Ref Range   Color, UA     Clarity, UA     Glucose, UA     Bilirubin, UA ++    Ketones, UA     Spec Grav, UA >=1.030 (A) 1.010 - 1.025   Blood, UA     pH, UA 6.0 5.0 - 8.0   Protein, UA 100    Urobilinogen, UA  0.2 or 1.0 E.U./dL   Nitrite, UA     Leukocytes, UA  Negative   Appearance     Odor     Pos dysuria , painful     No hx of kid or blddr infxn n     Pos pain and ten and irritatioi     patient arrives with 2 days history of abdominal discomfort.  Mild increased frequency.  Mild discomfort in lower abdominal area.  No fever no chills   Review of Systems  Genitourinary: Positive for dysuria.       Objective:   Physical Exam Alert1 active good hydration HEENT normal lungs clear.  Heart regular rate and rhythm abdomen benign prostate boggy somewhat tender  Urinalysis 2-3 white blood cells per high-power field       Assessment & Plan:  Impression acute prostatitis discussed plan antibiotics prescribed symptom care discussed warning signs discussed

## 2017-09-02 ENCOUNTER — Encounter: Payer: Self-pay | Admitting: Family Medicine

## 2017-09-02 ENCOUNTER — Ambulatory Visit (INDEPENDENT_AMBULATORY_CARE_PROVIDER_SITE_OTHER): Payer: 59 | Admitting: Family Medicine

## 2017-09-02 VITALS — BP 132/90 | Temp 98.9°F | Ht 67.0 in | Wt 232.0 lb

## 2017-09-02 DIAGNOSIS — J019 Acute sinusitis, unspecified: Secondary | ICD-10-CM | POA: Diagnosis not present

## 2017-09-02 DIAGNOSIS — J029 Acute pharyngitis, unspecified: Secondary | ICD-10-CM

## 2017-09-02 LAB — POCT RAPID STREP A (OFFICE): RAPID STREP A SCREEN: NEGATIVE

## 2017-09-02 MED ORDER — AZITHROMYCIN 250 MG PO TABS
ORAL_TABLET | ORAL | 0 refills | Status: DC
Start: 2017-09-02 — End: 2017-10-05

## 2017-09-02 MED ORDER — OSELTAMIVIR PHOSPHATE 75 MG PO CAPS: 75.0000 mg | ORAL_CAPSULE | Freq: Two times a day (BID) | ORAL | 0 refills | Status: DC

## 2017-09-02 NOTE — Progress Notes (Signed)
   Subjective:    Patient ID: Mike Harvey, male    DOB: 15-Jun-1986, 32 y.o.   MRN: [id number]  Sinusitis  This is a new problem. Episode onset: 2 days. Associated symptoms include congestion, coughing, headaches and a sore throat. Pertinent negatives include no chills or ear pain. (Body aches) Treatments tried: tylenol cold and flu.   Significant sore throat some body aches denies high fever chills denies head congestion but having a little bit of postnasal drainage and little bit of coughing throat erythematous   Review of Systems  Constitutional: Negative for activity change, chills and fever.  HENT: Positive for congestion, rhinorrhea and sore throat. Negative for ear pain.   Eyes: Negative for discharge.  Respiratory: Positive for cough. Negative for wheezing.   Cardiovascular: Negative for chest pain.  Gastrointestinal: Negative for nausea and vomiting.  Musculoskeletal: Negative for arthralgias.  Neurological: Positive for headaches.       Objective:   Physical Exam  Constitutional: He appears well-developed.  HENT:  Head: Normocephalic and atraumatic.  Mouth/Throat: No oropharyngeal exudate.  Throat erythematous no exudate  Eyes: Right eye exhibits no discharge. Left eye exhibits no discharge.  Neck: Normal range of motion.  Cardiovascular: Normal rate, regular rhythm and normal heart sounds.  No murmur heard. Pulmonary/Chest: Effort normal and breath sounds normal. No respiratory distress. He has no wheezes. He has no rales.  Lymphadenopathy:    He has no cervical adenopathy.  Neurological: He exhibits normal muscle tone.  Skin: Skin is warm and dry.  Nursing note and vitals reviewed.   Rapid strep negative       Assessment & Plan:  Sore throat but impressive erythema Probable sinusitis Antibiotics prescribed with the help take care of the throat as well If progressive symptoms or if worse follow-up I do not feel the patient has actually I do not feel he  needs x-rays or lab work currently If getting worse to notify us

## 2017-09-03 LAB — STREP A DNA PROBE: Strep Gp A Direct, DNA Probe: NEGATIVE

## 2017-10-05 ENCOUNTER — Ambulatory Visit (INDEPENDENT_AMBULATORY_CARE_PROVIDER_SITE_OTHER): Payer: 59 | Admitting: Family Medicine

## 2017-10-05 ENCOUNTER — Ambulatory Visit (HOSPITAL_COMMUNITY)
Admission: RE | Admit: 2017-10-05 | Discharge: 2017-10-05 | Disposition: A | Discharge status: Home / Self Care | Payer: 59 | Source: Ambulatory Visit | Attending: Family Medicine | Admitting: Family Medicine

## 2017-10-05 ENCOUNTER — Other Ambulatory Visit (HOSPITAL_COMMUNITY)
Admission: RE | Admit: 2017-10-05 | Discharge: 2017-10-05 | Disposition: A | Discharge status: Home / Self Care | Payer: 59 | Source: Ambulatory Visit | Attending: Family Medicine | Admitting: Family Medicine

## 2017-10-05 ENCOUNTER — Encounter: Payer: Self-pay | Admitting: Family Medicine

## 2017-10-05 VITALS — BP 128/84 | Temp 98.8°F | Ht 67.0 in | Wt 234.0 lb

## 2017-10-05 DIAGNOSIS — R1032 Left lower quadrant pain: Secondary | ICD-10-CM | POA: Diagnosis not present

## 2017-10-05 DIAGNOSIS — K5792 Diverticulitis of intestine, part unspecified, without perforation or abscess without bleeding: Secondary | ICD-10-CM

## 2017-10-05 DIAGNOSIS — E109 Type 1 diabetes mellitus without complications: Secondary | ICD-10-CM | POA: Diagnosis not present

## 2017-10-05 DIAGNOSIS — R109 Unspecified abdominal pain: Secondary | ICD-10-CM | POA: Insufficient documentation

## 2017-10-05 LAB — CBC WITH DIFFERENTIAL/PLATELET
Basophils Absolute: 0 10*3/uL (ref 0.0–0.1)
Basophils Relative: 0 %
EOS PCT: 0 %
Eosinophils Absolute: 0 10*3/uL (ref 0.0–0.7)
HCT: 48.9 % (ref 39.0–52.0)
HEMOGLOBIN: 16.3 g/dL (ref 13.0–17.0)
LYMPHS ABS: 1.9 10*3/uL (ref 0.7–4.0)
LYMPHS PCT: 24 %
MCH: 31.7 pg (ref 26.0–34.0)
MCHC: 33.3 g/dL (ref 30.0–36.0)
MCV: 95.1 fL (ref 78.0–100.0)
Monocytes Absolute: 0.6 10*3/uL (ref 0.1–1.0)
Monocytes Relative: 7 %
NEUTROS ABS: 5.4 10*3/uL (ref 1.7–7.7)
NEUTROS PCT: 69 %
PLATELETS: 338 10*3/uL (ref 150–400)
RBC: 5.14 MIL/uL (ref 4.22–5.81)
RDW: 13.2 % (ref 11.5–15.5)
WBC: 7.9 10*3/uL (ref 4.0–10.5)

## 2017-10-05 LAB — HEPATIC FUNCTION PANEL
ALT: 61 U/L (ref 17–63)
AST: 31 U/L (ref 15–41)
Albumin: 4.3 g/dL (ref 3.5–5.0)
Alkaline Phosphatase: 88 U/L (ref 38–126)
BILIRUBIN DIRECT: 0.1 mg/dL (ref 0.1–0.5)
BILIRUBIN TOTAL: 1.1 mg/dL (ref 0.3–1.2)
Indirect Bilirubin: 1 mg/dL — ABNORMAL HIGH (ref 0.3–0.9)
Total Protein: 8 g/dL (ref 6.5–8.1)

## 2017-10-05 LAB — BASIC METABOLIC PANEL
ANION GAP: 11 (ref 5–15)
BUN: 14 mg/dL (ref 6–20)
CHLORIDE: 104 mmol/L (ref 101–111)
CO2: 23 mmol/L (ref 22–32)
Calcium: 9.3 mg/dL (ref 8.9–10.3)
Creatinine, Ser: 0.94 mg/dL (ref 0.61–1.24)
GFR calc non Af Amer: >60 mL/min (ref >60)
GLUCOSE: 96 mg/dL (ref 65–99)
Potassium: 3.7 mmol/L (ref 3.5–5.1)
Sodium: 138 mmol/L (ref 135–145)

## 2017-10-05 LAB — POCT URINALYSIS DIPSTICK
PH UA: 7 (ref 5.0–8.0)
Spec Grav, UA: 1.02 (ref 1.010–1.025)

## 2017-10-05 LAB — LIPASE, BLOOD: LIPASE: 24 U/L (ref 11–51)

## 2017-10-05 MED ORDER — IOPAMIDOL (ISOVUE-300) INJECTION 61%
100.0000 mL | Freq: Once | INTRAVENOUS | Status: AC | PRN
  Administered 2017-10-05: 100 mL via INTRAVENOUS

## 2017-10-05 MED ORDER — METRONIDAZOLE 500 MG PO TABS: 500.0000 mg | ORAL_TABLET | Freq: Three times a day (TID) | ORAL | 0 refills | Status: DC

## 2017-10-05 MED ORDER — CIPROFLOXACIN HCL 500 MG PO TABS: 500.0000 mg | ORAL_TABLET | Freq: Two times a day (BID) | ORAL | 0 refills | Status: DC

## 2017-10-05 NOTE — Progress Notes (Signed)
   Subjective:    Patient ID: Mike PriestJoe W Rasheed, male    DOB: [birth year], 32 y.o.   MRN: [id number]  HPI  Patient is here today with complaints of lower back and adb pain for three days now, He is taking Advil. Patient with several days of lower back and lower abdominal pain denies dysuria urinary frequency denies high fever chills sweats denies wheezing or difficulty breathing  Patient is concerned about the possibility of diverticulitis denies dysuria denies frequency Review of Systems  Constitutional: Negative for activity change, appetite change and fatigue.  HENT: Negative for congestion and rhinorrhea.   Respiratory: Negative for cough, chest tightness and shortness of breath.   Cardiovascular: Negative for chest pain and leg swelling.  Gastrointestinal: Positive for abdominal pain and nausea. Negative for diarrhea and vomiting.  Endocrine: Negative for polydipsia and polyphagia.  Genitourinary: Positive for flank pain. Negative for dysuria and hematuria.  Neurological: Negative for weakness and headaches.  Psychiatric/Behavioral: Negative for confusion and dysphoric mood.       Objective:   Physical Exam  Constitutional: He appears well-nourished. No distress.  HENT:  Head: Normocephalic and atraumatic.  Eyes: Right eye exhibits no discharge. Left eye exhibits no discharge.  Neck: No tracheal deviation present.  Cardiovascular: Normal rate, regular rhythm and normal heart sounds.  No murmur heard. Pulmonary/Chest: Effort normal and breath sounds normal. No respiratory distress. He has no wheezes.  Abdominal: Soft. He exhibits no mass. There is tenderness. There is no rebound and no guarding.  Musculoskeletal: He exhibits no edema.  Lymphadenopathy:    He has no cervical adenopathy.  Neurological: He is alert.  Skin: Skin is warm. No rash noted.  Psychiatric: His behavior is normal.  Vitals reviewed.  Lungs clear heart regular respiratory rate normal no murmurs abdomen  significant lower abdominal tenderness on the left side.  Flanks nontender. Urinalysis negative for blood       Assessment & Plan:  Left lower abdomen Probable diverticulitis Stat CAT scan indicated Stat lab work indicated Await findings  The findings came back it pointed away from any severe finding no abscess no acute diverticulitis but because of diverticula seen as well as left lower quadrant pain I did recommend 1 week of antibiotics follow-up if symptoms do not completely go away within the next 5-6 days follow-up sooner problems patient understood

## 2018-05-03 ENCOUNTER — Encounter: Payer: Self-pay | Admitting: Family Medicine

## 2018-05-03 ENCOUNTER — Ambulatory Visit (INDEPENDENT_AMBULATORY_CARE_PROVIDER_SITE_OTHER): Payer: 59 | Admitting: Family Medicine

## 2018-05-03 VITALS — BP 136/84 | Ht 67.0 in | Wt 215.4 lb

## 2018-05-03 DIAGNOSIS — J019 Acute sinusitis, unspecified: Secondary | ICD-10-CM

## 2018-05-03 DIAGNOSIS — B9689 Other specified bacterial agents as the cause of diseases classified elsewhere: Secondary | ICD-10-CM | POA: Diagnosis not present

## 2018-05-03 MED ORDER — AMOXICILLIN-POT CLAVULANATE 875-125 MG PO TABS: 1.0000 | ORAL_TABLET | Freq: Two times a day (BID) | ORAL | 0 refills | Status: DC

## 2018-05-03 NOTE — Progress Notes (Signed)
   Subjective:    Patient ID: Mike Harvey, male    DOB: 03-29-1986, 32 y.o.   MRN: [id number]  Cough  This is a new problem. The current episode started 1 to 4 weeks ago. The cough is productive of sputum. Associated symptoms include rhinorrhea. Pertinent negatives include no chest pain, chills, ear pain, fever or wheezing. Treatments tried: Tylenol Cold and Flu. The treatment provided mild relief.   Symptoms going on multiple days now with coughing congestion sinus pressure denies high fever chills sweats wheezing difficulty breathing working a lot of hours 50 to 60 hours a week sometimes 70   Review of Systems  Constitutional: Negative for activity change, chills and fever.  HENT: Positive for congestion and rhinorrhea. Negative for ear pain.   Eyes: Negative for discharge.  Respiratory: Positive for cough. Negative for wheezing.   Cardiovascular: Negative for chest pain.  Gastrointestinal: Negative for nausea and vomiting.  Musculoskeletal: Negative for arthralgias.       Objective:   Physical Exam  Constitutional: He appears well-developed.  HENT:  Head: Normocephalic.  Mouth/Throat: Oropharynx is clear and moist. No oropharyngeal exudate.  Neck: Normal range of motion.  Cardiovascular: Normal rate, regular rhythm and normal heart sounds.  No murmur heard. Pulmonary/Chest: Effort normal and breath sounds normal. He has no wheezes.  Lymphadenopathy:    He has no cervical adenopathy.  Neurological: He exhibits normal muscle tone.  Skin: Skin is warm and dry.  Nursing note and vitals reviewed.   I am sure the fatigue of work has lessened his immune system bringing this to be a more likely issue      Assessment & Plan:  Patient was seen today for upper respiratory illness. It is felt that the patient is dealing with sinusitis.  Antibiotics were prescribed today. Importance of compliance with medication was discussed.  Symptoms should gradually resolve over the course of  the next several days. If high fevers, progressive illness, difficulty breathing, worsening condition or failure for symptoms to improve over the next several days then the patient is to follow-up.  If any emergent conditions the patient is to follow-up in the emergency department otherwise to follow-up in the office.

## 2018-09-30 ENCOUNTER — Other Ambulatory Visit: Payer: Self-pay

## 2018-09-30 MED ORDER — ALBUTEROL SULFATE HFA 108 (90 BASE) MCG/ACT IN AERS: 2.0000 | INHALATION_SPRAY | RESPIRATORY_TRACT | 0 refills | Status: DC | PRN

## 2018-12-02 IMAGING — CT CT ABD-PELV W/ CM
2 of 4 series · 16 of 46 positions shown, 18 images · IV contrast (Isovue)
Comparison: None.

CLINICAL DATA: Left lower quadrant abdominal pain since [REDACTED].

EXAM:
CT ABDOMEN AND PELVIS WITH CONTRAST
TECHNIQUE: Multidetector CT imaging of the abdomen and pelvis was performed
using the standard protocol following bolus administration of
intravenous contrast.
CONTRAST:  100mL MOJ4QJ-2HH IOPAMIDOL (MOJ4QJ-2HH) INJECTION 61%

[Series 2: axial st · axial · 0.81mm/px · z∈[+948,+1403]mm · 13 of 101 slices shown, 15 images]
[im 5/101  soft-tissue]
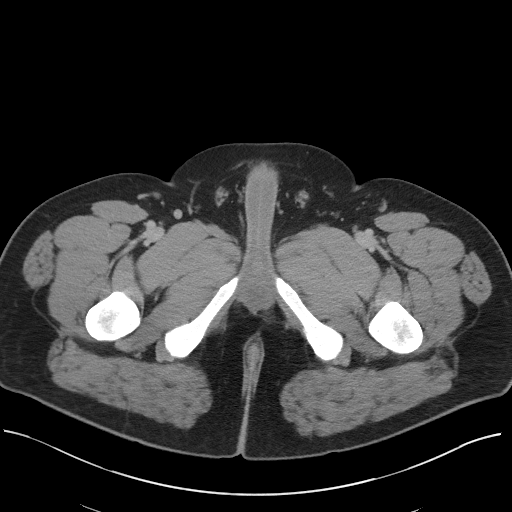
[im 5/101  bone]
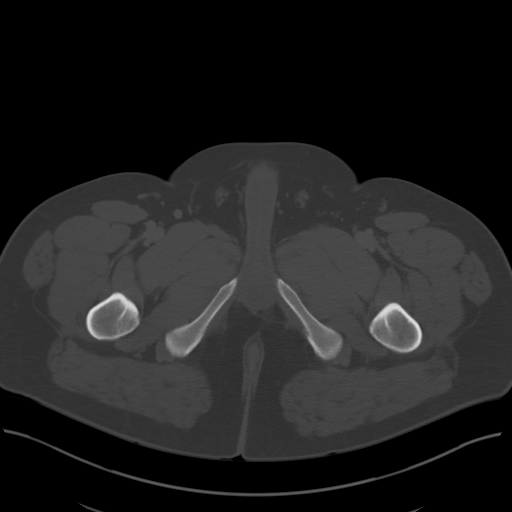
[im 14/101  soft-tissue]
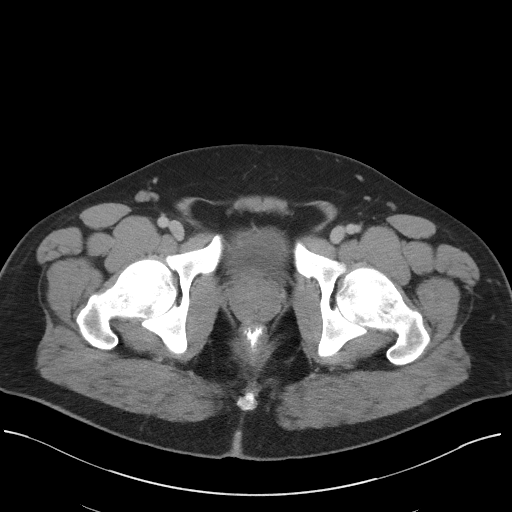
[im 22/101  soft-tissue]
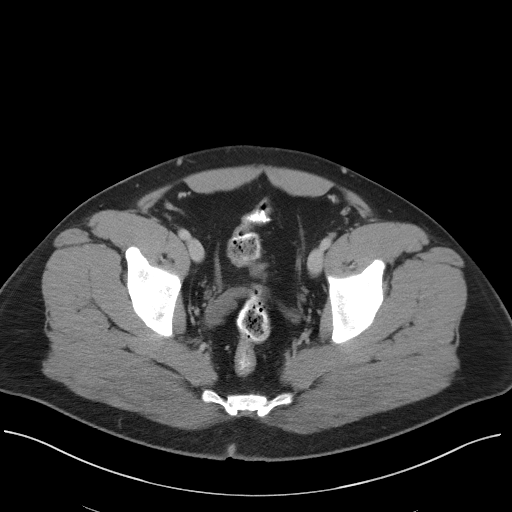
[im 27/101  soft-tissue]
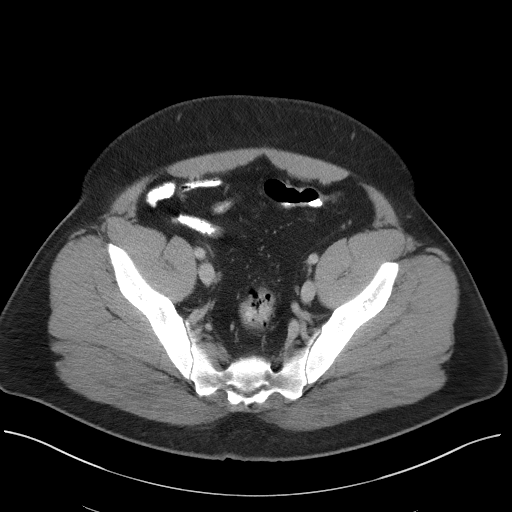
[im 35/101  soft-tissue]
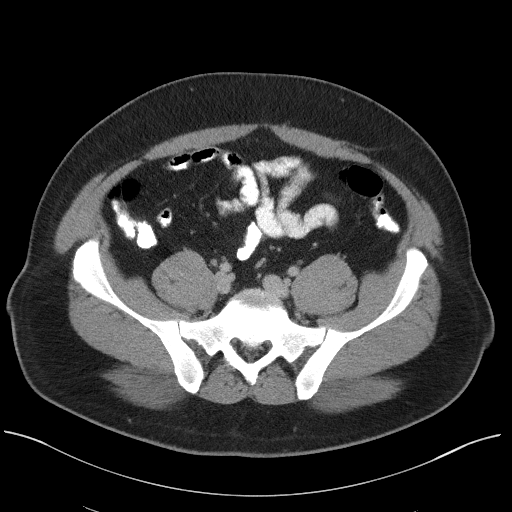
[im 44/101  soft-tissue]
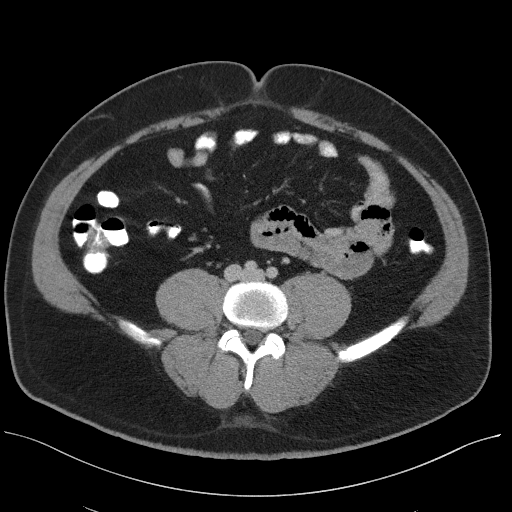
[im 53/101  soft-tissue]
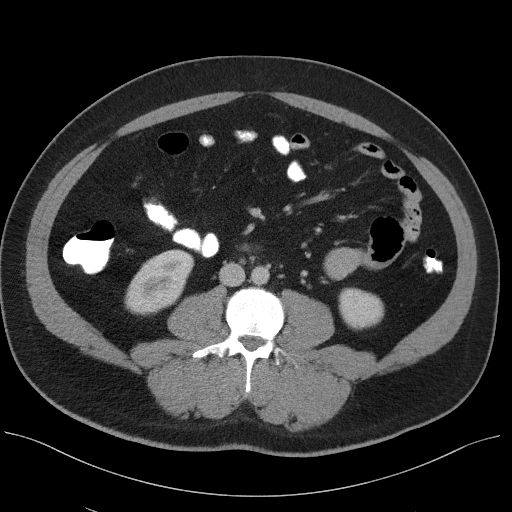
[im 57/101  soft-tissue]
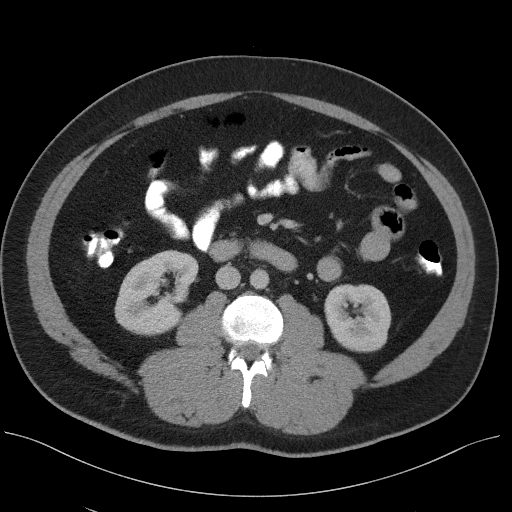
[im 66/101  soft-tissue]
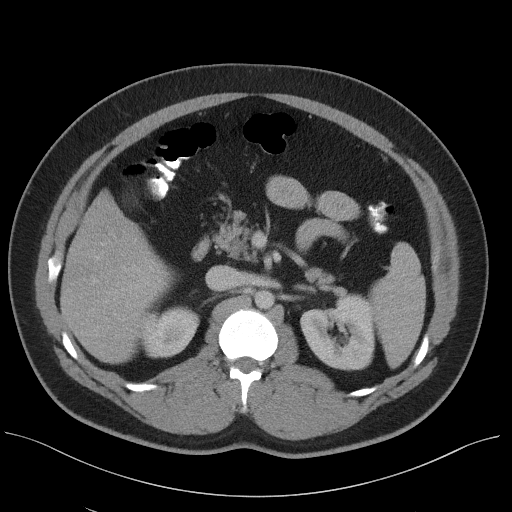
[im 66/101  bone]
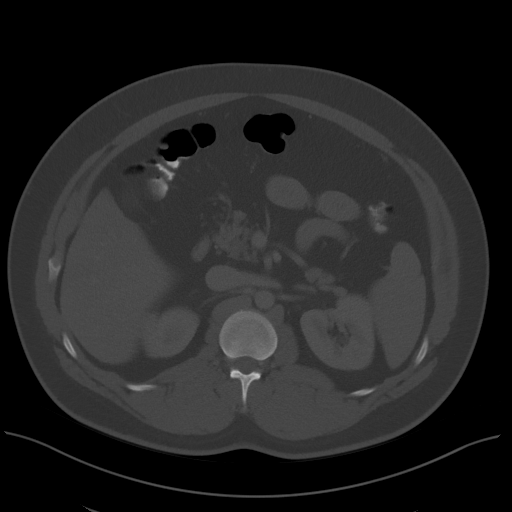
[im 74/101  soft-tissue]
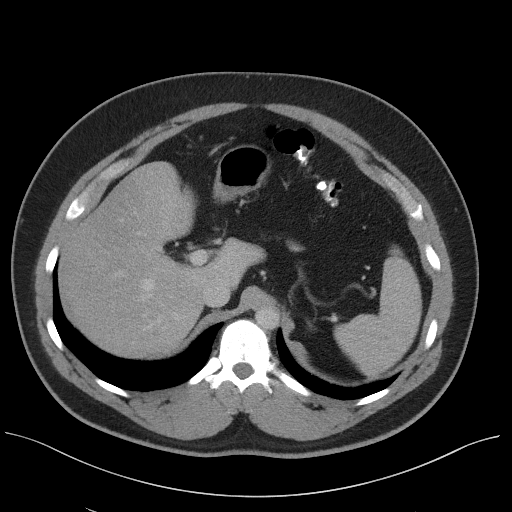
[im 79/101  soft-tissue]
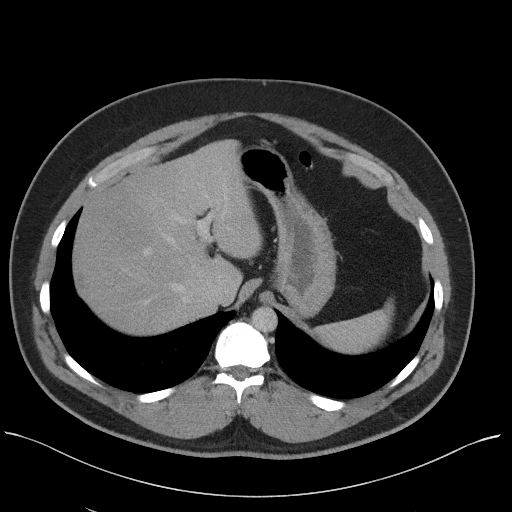
[im 87/101  soft-tissue]
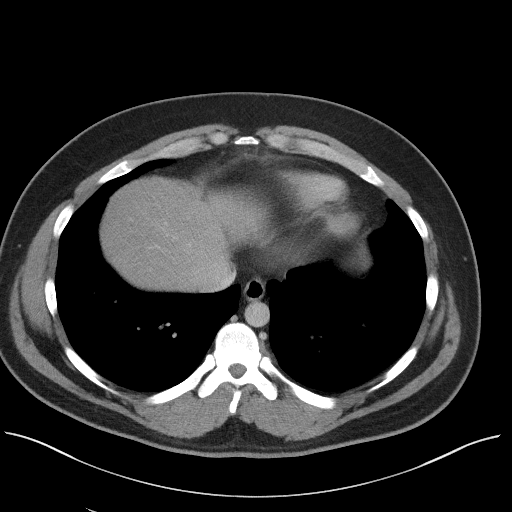
[im 96/101  soft-tissue]
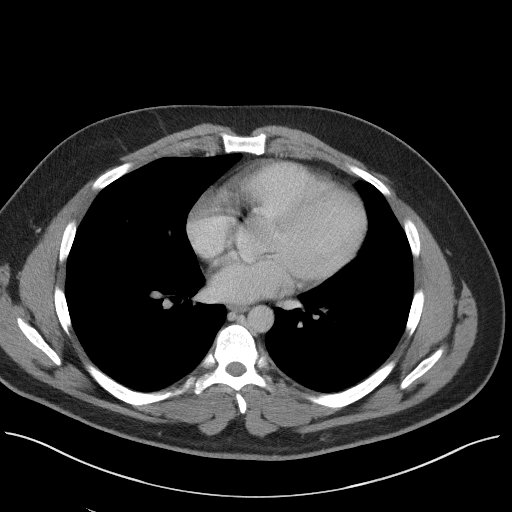

[Series 5: coronal st · coronal · 0.83mm/px · 3 of 117 slices shown]
[im 39/117  soft-tissue]
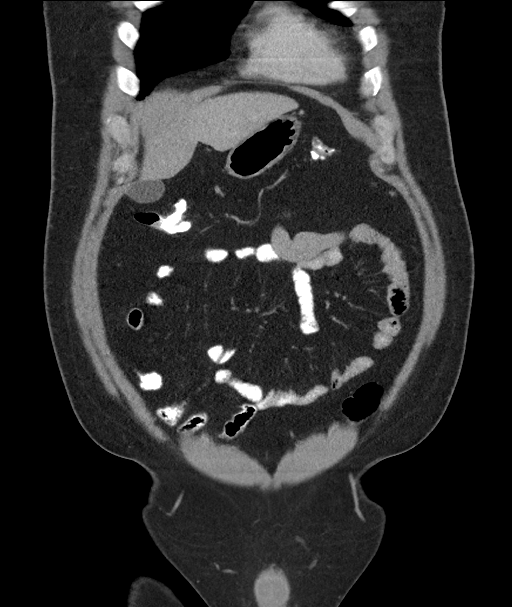
[im 52/117  soft-tissue]
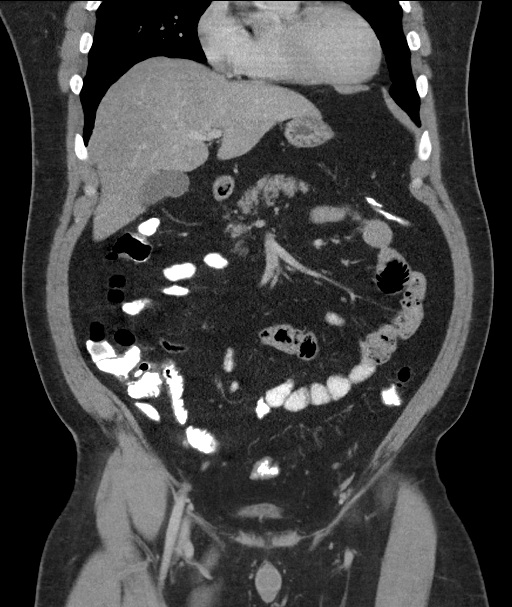
[im 65/117  soft-tissue]
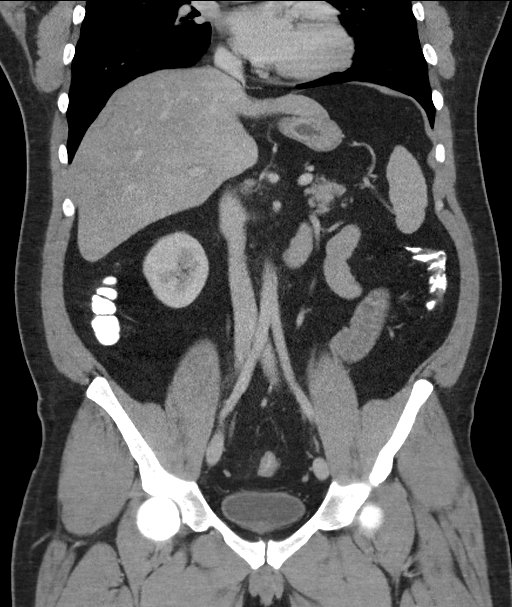

[16 of 46 positions shown; findings below may reference images not displayed]

FINDINGS: Lower chest: The lung bases are clear of acute process. No pleural
effusion or pulmonary lesions. The heart is normal in size. No
pericardial effusion. The distal esophagus and aorta are
unremarkable.

Hepatobiliary: No focal hepatic lesions or intrahepatic biliary
dilatation. The gallbladder is normal. No common bile duct
dilatation.

Pancreas: No mass, inflammation or ductal dilatation.

Spleen: Normal size.  No focal lesions.

Adrenals/Urinary Tract: The adrenal glands are normal. The kidneys,
ureters and bladder are unremarkable. No renal, ureteral or bladder
calculi or mass.

Stomach/Bowel: The stomach, duodenum, small bowel and colon are
unremarkable. No acute inflammatory changes, mass lesions or
obstructive findings. The terminal ileum is normal. The appendix is
not identified and may be surgically absent. Colonic diverticulosis
without findings for acute diverticulitis.

Vascular/Lymphatic: The aorta is normal in caliber. No dissection.
The branch vessels are patent. The major venous structures are
patent. No mesenteric or retroperitoneal mass or adenopathy. Small
scattered lymph nodes are noted.

Reproductive: The prostate gland and seminal vesicles are
unremarkable.

Other: No pelvic mass or adenopathy. No free pelvic fluid
collections. No inguinal mass or adenopathy. No abdominal wall
hernia or subcutaneous lesions.

Musculoskeletal: No significant bony findings.
IMPRESSION: 1. No acute abdominal/pelvic findings, mass lesions or adenopathy.
Specifically, no findings to suggest acute diverticulitis.
2. No renal, ureteral or bladder calculi or mass.

## 2018-12-05 ENCOUNTER — Ambulatory Visit (INDEPENDENT_AMBULATORY_CARE_PROVIDER_SITE_OTHER): Payer: 59 | Admitting: Family Medicine

## 2018-12-05 ENCOUNTER — Encounter: Payer: Self-pay | Admitting: Family Medicine

## 2018-12-05 ENCOUNTER — Other Ambulatory Visit: Payer: Self-pay

## 2018-12-05 VITALS — Temp 98.0°F | Wt 201.8 lb

## 2018-12-05 DIAGNOSIS — S91311A Laceration without foreign body, right foot, initial encounter: Secondary | ICD-10-CM | POA: Diagnosis not present

## 2018-12-05 DIAGNOSIS — M79671 Pain in right foot: Secondary | ICD-10-CM | POA: Diagnosis not present

## 2018-12-05 DIAGNOSIS — S91319A Laceration without foreign body, unspecified foot, initial encounter: Secondary | ICD-10-CM

## 2018-12-05 DIAGNOSIS — Z23 Encounter for immunization: Secondary | ICD-10-CM

## 2018-12-05 MED ORDER — MUPIROCIN 2 % EX OINT: TOPICAL_OINTMENT | CUTANEOUS | 0 refills | Status: DC

## 2018-12-05 MED ORDER — CEPHALEXIN 500 MG PO CAPS: 500.0000 mg | ORAL_CAPSULE | Freq: Four times a day (QID) | ORAL | 0 refills | Status: DC

## 2018-12-05 NOTE — Progress Notes (Signed)
   Subjective:    Patient ID: Mike Harvey, male    DOB: 08-30-1985, 33 y.o.   MRN: [id number]  HPIlaceration on right foot. Happened yesterday when getting out of his truck and scraped foot across rusty part of truck. Pt wants tetanus vaccine today.  Patient relates that he scraped foot because a cut on his small toe caused a cut on the other toe neither one needed stitches but the small toe became red bruised tender Denies any fever does relate some pain with walking on it no numbness  Review of Systems Denies any high fever chills ankle pain knee pain calf pain    Objective:   Physical Exam  On examination he has 2 different abrasions one on top of the foot one on the small toe he also has some cellulitis in the toe  15 minutes was spent with patient today discussing healthcare issues which they came.  More than 50% of this visit-total duration of visit-was spent in counseling and coordination of care.  Please see diagnosis regarding the focus of this coordination and care     Assessment & Plan:  Tetanus shot today Antibiotics Warning signs discussed Clean daily Follow-up if progressive troubles or worse Warning signs such as fever significant redness swelling should be brought to our attention

## 2018-12-26 ENCOUNTER — Encounter: Payer: Self-pay | Admitting: Nurse Practitioner

## 2018-12-26 ENCOUNTER — Other Ambulatory Visit: Payer: Self-pay

## 2018-12-26 ENCOUNTER — Ambulatory Visit (INDEPENDENT_AMBULATORY_CARE_PROVIDER_SITE_OTHER): Payer: 59 | Admitting: Nurse Practitioner

## 2018-12-26 VITALS — BP 132/86 | Temp 97.5°F | Ht 67.0 in | Wt 201.8 lb

## 2018-12-26 DIAGNOSIS — Z131 Encounter for screening for diabetes mellitus: Secondary | ICD-10-CM

## 2018-12-26 DIAGNOSIS — Z0000 Encounter for general adult medical examination without abnormal findings: Secondary | ICD-10-CM

## 2018-12-26 DIAGNOSIS — Z13220 Encounter for screening for lipoid disorders: Secondary | ICD-10-CM

## 2018-12-26 NOTE — Patient Instructions (Signed)
Voltaren gel  TENS unit Ibuprofen 200 mg up to 4 three times per day

## 2018-12-26 NOTE — Progress Notes (Signed)
Subjective:    Patient ID: Mike Harvey, male    DOB: 06-May-1986, 33 y.o.   MRN: [id number]  HPI The patient comes in today for a wellness visit.    A review of their health history was completed.  A review of medications was also completed.  Any needed refills; no  Eating habits: eating good  Falls/  MVA accidents in past few months: none  Regular exercise: exercising when he can  Specialist pt sees on regular basis: no  Preventative health issues were discussed.   Additional concerns: none  Presents for his annual physical.  Has been working hard at his weight loss, has lost 33 pounds.  Trying to stay as active as possible during [place].  Regular vision and dental exams.  Denies any sexual activity, defers any STI screening.  Asthma has been stable.   Review of Systems  Constitutional: Negative for activity change, appetite change and fatigue.  HENT: Negative for dental problem, ear pain, sinus pressure and sore throat.   Respiratory: Negative for cough, chest tightness, shortness of breath and wheezing.   Cardiovascular: Negative for chest pain.  Gastrointestinal: Negative for abdominal distention, abdominal pain, constipation, diarrhea, nausea and vomiting.  Genitourinary: Negative for difficulty urinating, discharge, dysuria, enuresis, frequency, genital sores, penile pain, penile swelling, scrotal swelling, testicular pain and urgency.  Psychiatric/Behavioral:       Anxiety has improved since doing most of his work from home.    Depression screen [facility] 2/9 12/26/2018  Decreased Interest 0  Down, Depressed, Hopeless 0  PHQ - 2 Score 0        Objective:   Physical Exam Vitals signs reviewed.  Constitutional:      Appearance: Normal appearance. He is well-developed.  Neck:     Musculoskeletal: Normal range of motion and neck supple.     Thyroid: No thyromegaly.     Trachea: No tracheal deviation.     Comments: Thyroid nontender. No mass or goiter noted.   Cardiovascular:     Rate and Rhythm: Normal rate and regular rhythm.     Heart sounds: Normal heart sounds.  Pulmonary:     Effort: Pulmonary effort is normal.     Breath sounds: Normal breath sounds.  Abdominal:     General: There is no distension.     Palpations: Abdomen is soft.     Tenderness: There is no abdominal tenderness.  Genitourinary:    Comments: Defers GU exam. Denies any problems.  Lymphadenopathy:     Cervical: No cervical adenopathy.  Skin:    General: Skin is warm and dry.     Findings: No rash.  Neurological:     Mental Status: He is alert and oriented to person, place, and time.     Deep Tendon Reflexes: Reflexes are normal and symmetric.  Psychiatric:        Mood and Affect: Mood normal.        Behavior: Behavior normal.        Thought Content: Thought content normal.        Judgment: Judgment normal.           Assessment & Plan:   Problem List Items Addressed This Visit    None    Visit Diagnoses    Annual physical exam    -  Primary   Screening, lipid       Relevant Orders   Lipid panel   Encounter for screening examination for impaired glucose regulation and diabetes  mellitus       Relevant Orders   COMPLETE METABOLIC PANEL WITH GFR     Encouraged continued weight loss efforts and regular activity.  Routine lab work ordered.  Recheck in 1 year, call back sooner if any problems.

## 2018-12-30 LAB — COMPREHENSIVE METABOLIC PANEL
ALT: 46 IU/L — ABNORMAL HIGH (ref 0–44)
AST: 25 IU/L (ref 0–40)
Albumin/Globulin Ratio: 1.6 (ref 1.2–2.2)
Albumin: 4.4 g/dL (ref 4.0–5.0)
Alkaline Phosphatase: 78 IU/L (ref 39–117)
BUN/Creatinine Ratio: 9 (ref 9–20)
BUN: 11 mg/dL (ref 6–20)
Bilirubin Total: 0.6 mg/dL (ref 0.0–1.2)
CO2: 22 mmol/L (ref 20–29)
Calcium: 9.2 mg/dL (ref 8.7–10.2)
Chloride: 106 mmol/L (ref 96–106)
Creatinine, Ser: 1.19 mg/dL (ref 0.76–1.27)
GFR calc Af Amer: 93 mL/min/{1.73_m2} (ref >59)
GFR calc non Af Amer: 80 mL/min/{1.73_m2} (ref >59)
Globulin, Total: 2.7 g/dL (ref 1.5–4.5)
Glucose: 92 mg/dL (ref 65–99)
Potassium: 4.9 mmol/L (ref 3.5–5.2)
Sodium: 141 mmol/L (ref 134–144)
Total Protein: 7.1 g/dL (ref 6.0–8.5)

## 2018-12-30 LAB — LIPID PANEL
Chol/HDL Ratio: 5.1 ratio — ABNORMAL HIGH (ref 0.0–5.0)
Cholesterol, Total: 167 mg/dL (ref 100–199)
HDL: 33 mg/dL — ABNORMAL LOW (ref >39)
LDL Calculated: 118 mg/dL — ABNORMAL HIGH (ref 0–99)
Triglycerides: 79 mg/dL (ref 0–149)
VLDL Cholesterol Cal: 16 mg/dL (ref 5–40)

## 2018-12-30 LAB — SPECIMEN STATUS REPORT

## 2019-03-29 ENCOUNTER — Other Ambulatory Visit: Payer: Self-pay

## 2019-03-29 ENCOUNTER — Ambulatory Visit (INDEPENDENT_AMBULATORY_CARE_PROVIDER_SITE_OTHER): Payer: No Typology Code available for payment source | Admitting: Family Medicine

## 2019-03-29 DIAGNOSIS — B349 Viral infection, unspecified: Secondary | ICD-10-CM | POA: Diagnosis not present

## 2019-03-29 NOTE — Progress Notes (Signed)
   Subjective:    Patient ID: Mike Harvey, male    DOB: 1985/11/12, 33 y.o.   MRN: [id number]  Fever  This is a new problem. The current episode started today. Associated symptoms include congestion, coughing and muscle aches. Pertinent negatives include no chest pain, ear pain, nausea, vomiting or wheezing. Treatments tried: allergy med.   Patient with fever body aches not feeling good also head congestion drainage some coughing but no wheezing no difficulty breathing no passing out no vomiting Did have a little bit of diarrhea no rash some chills just started feeling bad over the past couple days PMH respiratory illnesses   Review of Systems  Constitutional: Positive for fatigue and fever. Negative for activity change and chills.  HENT: Positive for congestion and rhinorrhea. Negative for ear pain.   Eyes: Negative for discharge.  Respiratory: Positive for cough. Negative for wheezing.   Cardiovascular: Negative for chest pain.  Gastrointestinal: Negative for nausea and vomiting.  Musculoskeletal: Negative for arthralgias.   Virtual Visit via Video Note  I connected with Mike Harvey on 03/29/19 at 10:20 AM EDT by a video enabled telemedicine application and verified that I am speaking with the correct person using two identifiers.  Location: Patient: home Provider: office   I discussed the limitations of evaluation and management by telemedicine and the availability of in person appointments. The patient expressed understanding and agreed to proceed.  History of Present Illness:    Observations/Objective:   Assessment and Plan:   Follow Up Instructions:    I discussed the assessment and treatment plan with the patient. The patient was provided an opportunity to ask questions and all were answered. The patient agreed with the plan and demonstrated an understanding of the instructions.   The patient was advised to call back or seek an in-person evaluation if the  symptoms worsen or if the condition fails to improve as anticipated.  I provided 17 minutes of non-face-to-face time during this encounter.        Objective:   Physical Exam  Patient had virtual visit Appears to be in no distress Atraumatic Neuro able to relate and oriented No apparent resp distress Color normal       Assessment & Plan:  Viral syndrome Possible COVID Warning signs discussed in detail Follow-up if progressive troubles or worse Recheck if any particular problems Use albuterol PRN COVID testing recommended

## 2019-03-30 ENCOUNTER — Other Ambulatory Visit: Payer: Self-pay | Admitting: *Deleted

## 2019-03-30 DIAGNOSIS — Z20822 Contact with and (suspected) exposure to covid-19: Secondary | ICD-10-CM

## 2019-03-31 ENCOUNTER — Encounter: Payer: Self-pay | Admitting: Family Medicine

## 2019-03-31 LAB — NOVEL CORONAVIRUS, NAA: SARS-CoV-2, NAA: NOT DETECTED

## 2019-05-31 ENCOUNTER — Ambulatory Visit: Payer: No Typology Code available for payment source | Admitting: Family Medicine

## 2019-05-31 ENCOUNTER — Ambulatory Visit (INDEPENDENT_AMBULATORY_CARE_PROVIDER_SITE_OTHER): Payer: No Typology Code available for payment source | Admitting: Family Medicine

## 2019-05-31 ENCOUNTER — Other Ambulatory Visit: Payer: Self-pay

## 2019-05-31 VITALS — Temp 97.9°F

## 2019-05-31 DIAGNOSIS — S90425A Blister (nonthermal), left lesser toe(s), initial encounter: Secondary | ICD-10-CM

## 2019-05-31 DIAGNOSIS — L03119 Cellulitis of unspecified part of limb: Secondary | ICD-10-CM

## 2019-05-31 DIAGNOSIS — M79671 Pain in right foot: Secondary | ICD-10-CM

## 2019-05-31 DIAGNOSIS — M79672 Pain in left foot: Secondary | ICD-10-CM

## 2019-05-31 MED ORDER — CEPHALEXIN 500 MG PO CAPS: 500.0000 mg | ORAL_CAPSULE | Freq: Four times a day (QID) | ORAL | 0 refills | Status: DC

## 2019-05-31 MED ORDER — MUPIROCIN 2 % EX OINT: TOPICAL_OINTMENT | CUTANEOUS | 3 refills | Status: DC

## 2019-05-31 NOTE — Progress Notes (Signed)
   Subjective:    Patient ID: Mike Harvey, male    DOB: 02-10-1986, 33 y.o.   MRN: [id number]  HPIpt states he wore the wrong shoes walking Saturday and now has blisters on both feet.  Patient with bilateral blisters worse on the left than the right Left foot also swelling red and tender Denies any previous troubles or injuries Was doing a lot of walking and developed the blisters and states he is having a lot of pain and discomfort currently denies fever chills sweats   Review of Systems  Constitutional: Negative for activity change, fatigue and fever.  HENT: Negative for congestion and rhinorrhea.   Respiratory: Negative for cough and shortness of breath.   Cardiovascular: Negative for chest pain and leg swelling.  Gastrointestinal: Negative for abdominal pain, diarrhea and nausea.  Genitourinary: Negative for dysuria and hematuria.  Neurological: Negative for weakness and headaches.  Psychiatric/Behavioral: Negative for agitation and behavioral problems.       Objective:   Physical Exam Vitals signs reviewed.  Cardiovascular:     Rate and Rhythm: Normal rate and regular rhythm.     Heart sounds: Normal heart sounds. No murmur.  Pulmonary:     Effort: Pulmonary effort is normal.     Breath sounds: Normal breath sounds.  Lymphadenopathy:     Cervical: No cervical adenopathy.  Neurological:     Mental Status: He is alert.  Psychiatric:        Behavior: Behavior normal.    Large blisters on both feet one on the right does not appear to be infected the one on the left has cellulitis on top of the foot as well as increased size of the blister  After obtaining consent from the patient #11 blade was used in a sterile fashion to cut into both blisters and then using iris scissors dead skin was trimmed away Then Bactroban ointment was placed with a dressing of both sides     Assessment & Plan:  Foot cellulitis left side No sign of abscess Bilateral blisters were trimmed  and removed on both sides This was after patient consented to do so His feet would not heal without having this done He will do Bactroban ointment daily along with dressings Cephalexin 4 times daily for 10 days Warm compresses frequently left foot Warm soapy water soaks 5 to 10 minutes 4 times a day for the next few days If he has progressive troubles or problems he will notify us

## 2019-06-23 ENCOUNTER — Other Ambulatory Visit (INDEPENDENT_AMBULATORY_CARE_PROVIDER_SITE_OTHER): Payer: No Typology Code available for payment source | Admitting: *Deleted

## 2019-06-23 DIAGNOSIS — Z23 Encounter for immunization: Secondary | ICD-10-CM

## 2019-08-21 ENCOUNTER — Ambulatory Visit: Payer: No Typology Code available for payment source | Attending: Internal Medicine

## 2019-08-21 ENCOUNTER — Other Ambulatory Visit: Payer: Self-pay

## 2019-08-21 DIAGNOSIS — Z20822 Contact with and (suspected) exposure to covid-19: Secondary | ICD-10-CM

## 2019-08-22 LAB — NOVEL CORONAVIRUS, NAA: SARS-CoV-2, NAA: NOT DETECTED

## 2019-09-03 ENCOUNTER — Ambulatory Visit (INDEPENDENT_AMBULATORY_CARE_PROVIDER_SITE_OTHER): Payer: No Typology Code available for payment source

## 2019-09-03 ENCOUNTER — Other Ambulatory Visit: Payer: Self-pay

## 2019-09-03 ENCOUNTER — Ambulatory Visit
Admission: EM | Admit: 2019-09-03 | Discharge: 2019-09-03 | Disposition: A | Discharge status: Home / Self Care | Payer: No Typology Code available for payment source | Attending: Emergency Medicine | Admitting: Emergency Medicine

## 2019-09-03 DIAGNOSIS — M25471 Effusion, right ankle: Secondary | ICD-10-CM | POA: Diagnosis not present

## 2019-09-03 DIAGNOSIS — M25571 Pain in right ankle and joints of right foot: Secondary | ICD-10-CM

## 2019-09-03 DIAGNOSIS — S93401A Sprain of unspecified ligament of right ankle, initial encounter: Secondary | ICD-10-CM | POA: Diagnosis not present

## 2019-09-03 MED ORDER — IBUPROFEN 800 MG PO TABS: 800.0000 mg | ORAL_TABLET | Freq: Three times a day (TID) | ORAL | 0 refills | Status: DC

## 2019-09-03 NOTE — ED Provider Notes (Signed)
[facility]    CSN: [id number] Arrival date & time: 09/03/19  9563      History   Chief Complaint Chief Complaint  Patient presents with  . Ankle Injury    HPI Mike Harvey is a 34 y.o. male.   Presented to the urgent care with a complaint of right ankle pain and swelling for the past 1 day.  It started after twisting her right foot.  He localizes the pain to the right foot.  He describes the pain as constant and achy.  He has tried OTC medications without relief.  His symptoms are made worse with ROM.  He denies similar symptoms in the past.     The history is provided by the patient. No language interpreter was used.  Ankle Injury    Past Medical History:  Diagnosis Date  . Asthma   . White coat hypertension     Patient Active Problem List   Diagnosis Date Noted  . White coat hypertension 10/24/2014  . Asthma 12/08/2012    History reviewed. No pertinent surgical history.     Home Medications    Prior to Admission medications   Medication Sig Start Date End Date Taking? Authorizing Provider  albuterol (PROVENTIL HFA;VENTOLIN HFA) 108 (90 Base) MCG/ACT inhaler Inhale 2 puffs into the lungs every 4 (four) hours as needed. 09/30/18   Babs Sciara, MD  cephALEXin (KEFLEX) 500 MG capsule Take 1 capsule (500 mg total) by mouth 4 (four) times daily. 05/31/19   Babs Sciara, MD  ibuprofen (ADVIL) 800 MG tablet Take 1 tablet (800 mg total) by mouth 3 (three) times daily. Take with food 09/03/19   Slayden Mennenga, Zachery Dakins, FNP  mupirocin ointment (BACTROBAN) 2 % Apply to affected area up to 3 times daily 05/31/19 05/30/20  Babs Sciara, MD    Family History Family History  Problem Relation Age of Onset  . Kidney disease Mother   . Cancer Maternal Grandfather        lung cancer; smoker    Social History Social History   Tobacco Use  . Smoking status: Never Smoker  . Smokeless tobacco: Never Used  Substance Use Topics  . Alcohol use: No   Alcohol/week: 0.0 standard drinks  . Drug use: No     Allergies   Patient has no known allergies.   Review of Systems Review of Systems  Constitutional: Negative.   Respiratory: Negative.   Cardiovascular: Negative.   Musculoskeletal: Positive for joint swelling.  All other systems reviewed and are negative.    Physical Exam Triage Vital Signs ED Triage Vitals  Enc Vitals Group     BP 09/03/19 0832 (!) 155/107     Pulse Rate 09/03/19 0829 78     Resp 09/03/19 0829 16     Temp 09/03/19 0829 98.2 F (36.8 C)     Temp Source 09/03/19 0829 Oral     SpO2 09/03/19 0829 96 %     Weight --      Height --      Head Circumference --      Peak Flow --      Pain Score 09/03/19 0832 8     Pain Loc --      Pain Edu? --      Excl. in GC? --    No data found.  Updated Vital Signs BP (!) 155/107 (BP Location: Right Arm)   Pulse 78   Temp 98.2 F (36.8 C) (Oral)  Resp 16   SpO2 96%   Visual Acuity Right Eye Distance:   Left Eye Distance:   Bilateral Distance:    Right Eye Near:   Left Eye Near:    Bilateral Near:     Physical Exam Vitals and nursing note reviewed.  Constitutional:      General: He is not in acute distress.    Appearance: Normal appearance. He is normal weight. He is not toxic-appearing.  Cardiovascular:     Rate and Rhythm: Normal rate and regular rhythm.     Pulses: Normal pulses.     Heart sounds: Normal heart sounds. No murmur. No friction rub. No gallop.   Pulmonary:     Effort: Pulmonary effort is normal. No respiratory distress.     Breath sounds: Normal breath sounds. No stridor. No wheezing, rhonchi or rales.  Chest:     Chest wall: No tenderness.  Musculoskeletal:        General: Swelling and tenderness present. No deformity.     Right ankle: Swelling present. Tenderness present.     Left ankle: Normal.     Comments: Patient is able to partially bear weight and ambulate with pain.  The right ankle is without obvious asymmetry or  deformity when compared to the left ankle.  Patient can flex and extend, partially invert and evert with pain.  No obvious surface trauma, ecchymosis, warmth  Neurological:     Mental Status: He is alert.      UC Treatments / Results  Labs (all labs ordered are listed, but only abnormal results are displayed) Labs Reviewed - No data to display  EKG   Radiology DG Ankle Complete Right  Result Date: 09/03/2019 CLINICAL DATA:  Acute RIGHT ankle pain following fall yesterday. Initial encounter. EXAM: RIGHT ANKLE - COMPLETE 3+ VIEW COMPARISON:  06/12/2013 radiographs FINDINGS: Soft tissue swelling is present. No acute fracture, subluxation or dislocation. A small plantar calcaneal spur is present. No focal bony lesions are present. IMPRESSION: Soft tissue swelling without acute bony abnormality. Electronically Signed   By: Margarette Canada M.D.   On: 09/03/2019 09:20    Procedures Procedures (including critical care time)  Medications Ordered in UC Medications - No data to display  Initial Impression / Assessment and Plan / UC Course  I have reviewed the triage vital signs and the nursing notes.  Pertinent labs & imaging results that were available during my care of the patient were reviewed by me and considered in my medical decision making (see chart for details).    X-ray is negative for bony abnormality including fracture or dislocation.  I have reviewed the x-ray myself and the radiologist interpretation.  I am in agreement with the radiologist interpretation.  Patient is stable at discharge. Ibuprofen 800 was prescribed for pain management Advised patient to use ankle splint Follow-up with primary care Return or go to ED for worsening of symptoms  Final Clinical Impressions(s) / UC Diagnoses   Final diagnoses:  Pain and swelling of right ankle  Sprain of right ankle, unspecified ligament, initial encounter     Discharge Instructions     Ankle x-ray was negative for  acute fracture or dislocation Follow RICE instruction as attached Ibuprofen 800 was prescribed Advised patient to use ankle splint Follow-up with primary care Go to ED or return for worsening symptoms    ED Prescriptions    Medication Sig Dispense Auth. Provider   ibuprofen (ADVIL) 800 MG tablet Take 1 tablet (800 mg total)  by mouth 3 (three) times daily. Take with food 60 tablet Kamaile Zachow, Zachery Dakins, FNP     PDMP not reviewed this encounter.   Durward Parcel, FNP 09/03/19 [phone]

## 2019-09-03 NOTE — Discharge Instructions (Addendum)
Ankle x-ray was negative for acute fracture or dislocation Follow RICE instruction as attached Ibuprofen 800 was prescribed Advised patient to use ankle splint Follow-up with primary care Go to ED or return for worsening symptoms

## 2019-09-03 NOTE — ED Triage Notes (Signed)
Pt presents to UC w/ c/o rt ankle injury yesterday. Pt states he was just walking on uneven ground and twisted his right ankle inward. Pt is limping to room, able to bear some weight on it.

## 2020-01-22 ENCOUNTER — Telehealth (INDEPENDENT_AMBULATORY_CARE_PROVIDER_SITE_OTHER): Payer: No Typology Code available for payment source | Admitting: Family Medicine

## 2020-01-22 ENCOUNTER — Telehealth: Payer: Self-pay | Admitting: Family Medicine

## 2020-01-22 ENCOUNTER — Telehealth: Payer: Self-pay | Admitting: *Deleted

## 2020-01-22 DIAGNOSIS — J069 Acute upper respiratory infection, unspecified: Secondary | ICD-10-CM

## 2020-01-22 DIAGNOSIS — B349 Viral infection, unspecified: Secondary | ICD-10-CM

## 2020-01-22 NOTE — Telephone Encounter (Signed)
[name], cuffe are scheduled for a virtual visit with your provider today.    Just as we do with appointments in the office, we must obtain your consent to participate.  Your consent will be active for this visit and any virtual visit you may have with one of our providers in the next 365 days.    If you have a MyChart account, I can also send a copy of this consent to you electronically.  All virtual visits are billed to your insurance company just like a traditional visit in the office.  As this is a virtual visit, video technology does not allow for your provider to perform a traditional examination.  This may limit your provider's ability to fully assess your condition.  If your provider identifies any concerns that need to be evaluated in person or the need to arrange testing such as labs, EKG, etc, we will make arrangements to do so.    Although advances in technology are sophisticated, we cannot ensure that it will always work on either your end or our end.  If the connection with a video visit is poor, we may have to switch to a telephone visit.  With either a video or telephone visit, we are not always able to ensure that we have a secure connection.   I need to obtain your verbal consent now.   Are you willing to proceed with your visit today?   Mike Harvey has provided verbal consent on 01/22/2020 for a virtual visit (video or telephone).   Marlowe Shores, LPN 8/93/8101  7:51 AM

## 2020-01-22 NOTE — Telephone Encounter (Signed)
Did covid swab on pt outside today and he had a couple of questions about covid vaccine he wanted to ask dr Lorin Picket. I asked pt what was the questions and I would send back a note but pt stated he just wanted dr Lorin Picket to call him when he got a chance. Pt advised dr Lorin Picket had pts booked the rest of day but I would ask dr Lorin Picket if he could call

## 2020-01-22 NOTE — Progress Notes (Signed)
   Subjective:    Patient ID: Mike Harvey, male    DOB: [birth year], 34 y.o.   MRN: [id number]  HPI Pt started having sinus pressure on Friday. No energy, very fatigue, weakness in arms and legs on Saturday; laid around all weekend. Pt had sore throat but that has subsided. Pt has been taking Tylenol Cold and Flu each night; has helped with congestion some.  Patient states congestion doing somewhat better no sinus pain no wheezing currently Patient states less congestion states some body aches but now doing better.  Energy level still fatigue Virtual Visit via Video Note  I connected with JAMARL PEW on 01/22/20 at 11:00 AM EDT by a video enabled telemedicine application and verified that I am speaking with the correct person using two identifiers.  Location: Patient: home Provider: office   I discussed the limitations of evaluation and management by telemedicine and the availability of in person appointments. The patient expressed understanding and agreed to proceed.  History of Present Illness:    Observations/Objective:   Assessment and Plan:   Follow Up Instructions:    I discussed the assessment and treatment plan with the patient. The patient was provided an opportunity to ask questions and all were answered. The patient agreed with the plan and demonstrated an understanding of the instructions.   The patient was advised to call back or seek an in-person evaluation if the symptoms worsen or if the condition fails to improve as anticipated.  I provided 20 minutes of non-face-to-face time during this encounter.       Review of Systems See above    Objective:   Physical Exam  Patient had virtual visit Appears to be in no distress Atraumatic Neuro able to relate and oriented No apparent resp distress Color normal       Assessment & Plan:  Viral syndrome Recommend Covid test Rest at home next few days If Covid test negative then may resume regular  work No antibiotic indicated currently.

## 2020-01-23 LAB — SARS-COV-2, NAA 2 DAY TAT

## 2020-01-23 LAB — NOVEL CORONAVIRUS, NAA: SARS-CoV-2, NAA: NOT DETECTED

## 2020-01-25 NOTE — Telephone Encounter (Signed)
Nurses Please connect with patient let him know that I would like to call him tomorrow to discuss his questions regarding the vaccine.  I can call him in the vicinity of 440 pm to 5 pm  If that works for him I will call him near that time Please place it on my schedule If he is unable to do that time I can call him between 12:30 PM and 1 PM on Friday Once again placed on my schedule as a telephone discussion thank you

## 2020-01-25 NOTE — Telephone Encounter (Signed)
Tried to call no answer

## 2020-01-26 ENCOUNTER — Other Ambulatory Visit: Payer: Self-pay

## 2020-01-26 ENCOUNTER — Telehealth (INDEPENDENT_AMBULATORY_CARE_PROVIDER_SITE_OTHER): Payer: No Typology Code available for payment source | Admitting: Family Medicine

## 2020-01-26 DIAGNOSIS — Z289 Immunization not carried out for unspecified reason: Secondary | ICD-10-CM

## 2020-01-26 NOTE — Progress Notes (Signed)
   Subjective:    Patient ID: Mike Harvey, male    DOB: 09-25-1985, 34 y.o.   MRN: [id number]  HPIpt had some questions about covid vaccine.     Review of Systems     Objective:   Physical Exam  15 minutes spent with the patient discussing the vaccine.  I would recommend Moderna vaccine.  I believe the benefits outweigh the risk.  Patient should do well with the vaccine      Assessment & Plan:

## 2020-01-26 NOTE — Telephone Encounter (Signed)
Called and discussed with pt. Pt is able to do phone call at 4:40 to 5 today. Please add pt to dr scott schedule today at 4:40 for phone visit.

## 2020-01-29 ENCOUNTER — Encounter: Payer: Self-pay | Admitting: Emergency Medicine

## 2020-01-29 ENCOUNTER — Ambulatory Visit
Admission: EM | Admit: 2020-01-29 | Discharge: 2020-01-29 | Disposition: A | Discharge status: Home / Self Care | Payer: No Typology Code available for payment source | Attending: Family Medicine | Admitting: Family Medicine

## 2020-01-29 ENCOUNTER — Ambulatory Visit: Payer: Self-pay

## 2020-01-29 ENCOUNTER — Other Ambulatory Visit: Payer: Self-pay

## 2020-01-29 DIAGNOSIS — J039 Acute tonsillitis, unspecified: Secondary | ICD-10-CM | POA: Insufficient documentation

## 2020-01-29 DIAGNOSIS — R509 Fever, unspecified: Secondary | ICD-10-CM | POA: Insufficient documentation

## 2020-01-29 LAB — POCT RAPID STREP A (OFFICE): Rapid Strep A Screen: NEGATIVE

## 2020-01-29 MED ORDER — AZITHROMYCIN 250 MG PO TABS: 250.0000 mg | ORAL_TABLET | Freq: Every day | ORAL | 0 refills | Status: DC

## 2020-01-29 NOTE — Discharge Instructions (Addendum)
You have tonsillitis  I have sent in azithromycin for you to take. Take 2 tablets today, and one tablet for the next 4 days  Follow up with this office or with primary care if you are not improving over the next 2-3 days on antibiotics  Follow up in the ER for high fever, trouble swallowing, trouble breathing, other concerning symptoms

## 2020-01-29 NOTE — ED Triage Notes (Signed)
Sore throat and fever for past few days.  Pt reports he felt fatigued last week, had covid test that was negative

## 2020-02-01 LAB — CULTURE, GROUP A STREP (THRC)

## 2020-02-04 NOTE — ED Provider Notes (Signed)
[facility]   [id number] 01/29/20 Arrival Time: 1151  [id number] THROAT  SUBJECTIVE: History from: patient.  Mike Harvey is a 34 y.o. male who presents with abrupt onset of sore throat, fatigue and fever for the last 3 days. Denies to sick exposure to Covid, strep, flu or mono, or precipitating event.  Has not taken OTC medications for this. Symptoms are made worse with swallowing, but tolerating liquids and own secretions without difficulty. Denies previous symptoms in the past.     Denies chills, ear pain, sinus pain, rhinorrhea, nasal congestion, cough, SOB, wheezing, chest pain, nausea, rash, changes in bowel or bladder habits.     ROS: As per HPI.  All other pertinent ROS negative.     Past Medical History:  Diagnosis Date  . Asthma   . White coat hypertension    History reviewed. No pertinent surgical history. No Known Allergies No current facility-administered medications on file prior to encounter.   Current Outpatient Medications on File Prior to Encounter  Medication Sig Dispense Refill  . albuterol (PROVENTIL HFA;VENTOLIN HFA) 108 (90 Base) MCG/ACT inhaler Inhale 2 puffs into the lungs every 4 (four) hours as needed. 1 Inhaler 0  . ibuprofen (ADVIL) 800 MG tablet Take 1 tablet (800 mg total) by mouth 3 (three) times daily. Take with food 60 tablet 0   Social History   Socioeconomic History  . Marital status: Single    Spouse name: Not on file  . Number of children: Not on file  . Years of education: Not on file  . Highest education level: Not on file  Occupational History  . Not on file  Tobacco Use  . Smoking status: Never Smoker  . Smokeless tobacco: Never Used  Substance and Sexual Activity  . Alcohol use: No    Alcohol/week: 0.0 standard drinks  . Drug use: No  . Sexual activity: Never  Other Topics Concern  . Not on file  Social History Narrative  . Not on file   Social Determinants of Health   Financial Resource Strain:   .  Difficulty of Paying Living Expenses:   Food Insecurity:   . Worried About Programme researcher, broadcasting/film/video in the Last Year:   . Barista in the Last Year:   Transportation Needs:   . Freight forwarder (Medical):   Marland Kitchen Lack of Transportation (Non-Medical):   Physical Activity:   . Days of Exercise per Week:   . Minutes of Exercise per Session:   Stress:   . Feeling of Stress :   Social Connections:   . Frequency of Communication with Friends and Family:   . Frequency of Social Gatherings with Friends and Family:   . Attends Religious Services:   . Active Member of Clubs or Organizations:   . Attends Banker Meetings:   Marland Kitchen Marital Status:   Intimate Partner Violence:   . Fear of Current or Ex-Partner:   . Emotionally Abused:   Marland Kitchen Physically Abused:   . Sexually Abused:    Family History  Problem Relation Age of Onset  . Kidney disease Mother   . Cancer Maternal Grandfather        lung cancer; smoker    OBJECTIVE:  Vitals:   01/29/20 1224 01/29/20 1226  BP:  (!) 134/88  Pulse:  73  Resp:  17  Temp:  99.2 F (37.3 C)  TempSrc:  Oral  SpO2:  98%  Weight: (!) 213 lb (96.6 kg)  Height: 5\' 7"  (1.702 m)      General appearance: alert; appears fatigued, but nontoxic, speaking in full sentences and managing own secretions HEENT: NCAT; Ears: EACs clear, TMs pearly gray with visible cone of light, without erythema; Eyes: PERRL, EOMI grossly; Nose: no obvious rhinorrhea; Throat: oropharynx clear, tonsils 1+ and mildly erythematous without white tonsillar exudates, uvula midline Neck: supple without LAD Lungs: CTA bilaterally without adventitious breath sounds; cough absent Heart: regular rate and rhythm.  Radial pulses 2+ symmetrical bilaterally Skin: warm and dry Psychological: alert and cooperative; normal mood and affect  LABS: No results found for this or any previous visit (from the past 24 hour(s)).   ASSESSMENT & PLAN:  1. Acute tonsillitis, unspecified  etiology   2. Fever, unspecified fever cause     Meds ordered this encounter  Medications  . azithromycin (ZITHROMAX) 250 MG tablet    Sig: Take 1 tablet (250 mg total) by mouth daily. Take first 2 tablets together, then 1 every day until finished.    Dispense:  6 tablet    Refill:  0    Order Specific Question:   Supervising Provider    Answer:   Merrilee Jansky     Strep test negative, will send out for culture and we will call you with results Will treat for tonsillitis Prescribed azithromycin Get plenty of rest and push fluids Take OTC Zyrtec and use chloraseptic spray as needed for throat pain. Drink warm or cool liquids, use throat lozenges, or popsicles to help alleviate symptoms Take OTC ibuprofen or tylenol as needed for pain Follow up with PCP if symptoms persists Return or go to ER if patient has any new or worsening symptoms such as fever, chills, nausea, vomiting, worsening sore throat, cough, abdominal pain, chest pain, changes in bowel or bladder habits  Reviewed expectations re: course of current medical issues. Questions answered. Outlined signs and symptoms indicating need for more acute intervention. Patient verbalized understanding. After Visit Summary given.          [id number], NP 02/04/20 1928

## 2020-02-07 ENCOUNTER — Encounter: Payer: Self-pay | Admitting: Family Medicine

## 2020-12-09 ENCOUNTER — Encounter: Payer: Self-pay | Admitting: Emergency Medicine

## 2020-12-09 ENCOUNTER — Ambulatory Visit
Admission: EM | Admit: 2020-12-09 | Discharge: 2020-12-09 | Disposition: A | Discharge status: Home / Self Care | Payer: No Typology Code available for payment source | Attending: Emergency Medicine | Admitting: Emergency Medicine

## 2020-12-09 DIAGNOSIS — R030 Elevated blood-pressure reading, without diagnosis of hypertension: Secondary | ICD-10-CM

## 2020-12-09 DIAGNOSIS — B349 Viral infection, unspecified: Secondary | ICD-10-CM | POA: Diagnosis not present

## 2020-12-09 DIAGNOSIS — Z20828 Contact with and (suspected) exposure to other viral communicable diseases: Secondary | ICD-10-CM

## 2020-12-09 LAB — POCT RAPID STREP A (OFFICE): Rapid Strep A Screen: NEGATIVE

## 2020-12-09 MED ORDER — OSELTAMIVIR PHOSPHATE 75 MG PO CAPS: 75.0000 mg | ORAL_CAPSULE | Freq: Two times a day (BID) | ORAL | 0 refills | Status: DC

## 2020-12-09 NOTE — Discharge Instructions (Addendum)
Your rapid strep test is negative.  A throat culture is pending; we will call you if it is positive requiring treatment.    Your COVID and Influenza tests are pending.  You should self quarantine until the test results are back.  Take the Tamiflu as directed.  Take Tylenol or ibuprofen as needed for fever or discomfort.  Rest and keep yourself hydrated.  Follow-up with your primary care provider if your symptoms are not improving.    Your blood pressure is elevated today at 161/88.  Please have this rechecked by your primary care provider in 2-4 weeks.

## 2020-12-09 NOTE — ED Provider Notes (Signed)
Mike Harvey    CSN: [id number] Arrival date & time: 12/09/20  1436      History   Chief Complaint Chief Complaint  Patient presents with  . Cough  . Fever    HPI Mike Harvey is a 35 y.o. male.   Patient presents with sore throat, cough, low-grade fever, body aches since yesterday.  His sister tested positive for influenza and strep throat; he was with her and other family over the weekend.  He denies rash, shortness of breath, vomiting, diarrhea, or other symptoms.  No Tylenol or ibuprofen taken today.  His medical history includes asthma and whitecoat hypertension.  The history is provided by the patient and medical records.    Past Medical History:  Diagnosis Date  . Asthma   . White coat hypertension     Patient Active Problem List   Diagnosis Date Noted  . White coat hypertension 10/24/2014  . Asthma 12/08/2012    History reviewed. No pertinent surgical history.     Home Medications    Prior to Admission medications   Medication Sig Start Date End Date Taking? Authorizing Provider  cetirizine (ZYRTEC) 10 MG tablet Take 10 mg by mouth daily.   Yes [provider]  oseltamivir (TAMIFLU) 75 MG capsule Take 1 capsule (75 mg total) by mouth every 12 (twelve) hours. 12/09/20  Yes Mickie Bail, NP  albuterol (PROVENTIL HFA;VENTOLIN HFA) 108 (90 Base) MCG/ACT inhaler Inhale 2 puffs into the lungs every 4 (four) hours as needed. 09/30/18   Babs Sciara, MD  azithromycin (ZITHROMAX) 250 MG tablet Take 1 tablet (250 mg total) by mouth daily. Take first 2 tablets together, then 1 every day until finished. 01/29/20   Moshe Cipro, NP  ibuprofen (ADVIL) 800 MG tablet Take 1 tablet (800 mg total) by mouth 3 (three) times daily. Take with food 09/03/19   Durward Parcel, FNP    Family History Family History  Problem Relation Age of Onset  . Kidney disease Mother   . Cancer Maternal Grandfather        lung cancer; smoker    Social  History Social History   Tobacco Use  . Smoking status: Never Smoker  . Smokeless tobacco: Never Used  Substance Use Topics  . Alcohol use: No    Alcohol/week: 0.0 standard drinks  . Drug use: No     Allergies   Patient has no known allergies.   Review of Systems Review of Systems  Constitutional: Positive for fever. Negative for chills.  HENT: Positive for sore throat. Negative for ear pain.   Respiratory: Positive for cough. Negative for shortness of breath.   Cardiovascular: Negative for chest pain and palpitations.  Gastrointestinal: Negative for abdominal pain, diarrhea and vomiting.  Skin: Negative for color change and rash.  All other systems reviewed and are negative.    Physical Exam Triage Vital Signs ED Triage Vitals  Enc Vitals Group     BP      Pulse      Resp      Temp      Temp src      SpO2      Weight      Height      Head Circumference      Peak Flow      Pain Score      Pain Loc      Pain Edu?      Excl. in GC?    No  data found.  Updated Vital Signs BP (!) 161/88 (BP Location: Left Arm)   Pulse (!) 111   Temp 99.5 F (37.5 C) (Oral)   Resp 20   SpO2 96%   Visual Acuity Right Eye Distance:   Left Eye Distance:   Bilateral Distance:    Right Eye Near:   Left Eye Near:    Bilateral Near:     Physical Exam Vitals and nursing note reviewed.  Constitutional:      General: He is not in acute distress.    Appearance: He is well-developed.  HENT:     Head: Normocephalic and atraumatic.     Right Ear: Tympanic membrane normal.     Left Ear: Tympanic membrane normal.     Nose: Nose normal.     Mouth/Throat:     Mouth: Mucous membranes are moist.     Pharynx: Posterior oropharyngeal erythema present.  Eyes:     Conjunctiva/sclera: Conjunctivae normal.  Cardiovascular:     Rate and Rhythm: Normal rate and regular rhythm.     Heart sounds: Normal heart sounds.  Pulmonary:     Effort: Pulmonary effort is normal. No respiratory  distress.     Breath sounds: Normal breath sounds.  Abdominal:     Palpations: Abdomen is soft.     Tenderness: There is no abdominal tenderness.  Musculoskeletal:     Cervical back: Neck supple.  Skin:    General: Skin is warm and dry.     Findings: No rash.  Neurological:     General: No focal deficit present.     Mental Status: He is alert and oriented to person, place, and time.     Gait: Gait normal.  Psychiatric:        Mood and Affect: Mood normal.        Behavior: Behavior normal.      UC Treatments / Results  Labs (all labs ordered are listed, but only abnormal results are displayed) Labs Reviewed  COVID-19, FLU A+B NAA  CULTURE, GROUP A STREP [facility])  POCT RAPID STREP A (OFFICE)    EKG   Radiology No results found.  Procedures Procedures (including critical care time)  Medications Ordered in UC Medications - No data to display  Initial Impression / Assessment and Plan / UC Course  I have reviewed the triage vital signs and the nursing notes.  Pertinent labs & imaging results that were available during my care of the patient were reviewed by me and considered in my medical decision making (see chart for details).   Viral illness, exposure to influenza.  Elevated blood pressure reading.  Rapid strep negative; culture pending.  Influenza and COVID pending.  Instructed patient to self quarantine per CDC guidelines.  Because patient was recently exposed to influenza, treating with Tamiflu; Discussed with patient that he should stop taking the Tamiflu if his flu test comes back negative.  Discussed symptomatic treatment including Tylenol or ibuprofen, rest, hydration.  Instructed patient to follow up with PCP if his symptoms are not improving.  Discussed with patient his blood pressure is elevated today and needs to be rechecked by his PCP in 2 to 4 weeks.  Education provided on preventing hypertension.  Patient agrees to plan of care.    Final Clinical  Impressions(s) / UC Diagnoses   Final diagnoses:  Viral illness  Exposure to influenza  Elevated blood pressure reading     Discharge Instructions     Your rapid strep test is  negative.  A throat culture is pending; we will call you if it is positive requiring treatment.    Your COVID and Influenza tests are pending.  You should self quarantine until the test results are back.  Take the Tamiflu as directed.  Take Tylenol or ibuprofen as needed for fever or discomfort.  Rest and keep yourself hydrated.  Follow-up with your primary care provider if your symptoms are not improving.    Your blood pressure is elevated today at 161/88.  Please have this rechecked by your primary care provider in 2-4 weeks.         ED Prescriptions    Medication Sig Dispense Auth. Provider   oseltamivir (TAMIFLU) 75 MG capsule Take 1 capsule (75 mg total) by mouth every 12 (twelve) hours. 10 capsule Mickie Bail, NP     PDMP not reviewed this encounter.   Mickie Bail, NP 12/09/20 1531

## 2020-12-09 NOTE — ED Triage Notes (Signed)
Pt presents today with c/o of cough, fever and scratchy throat x 2 days. He does report his sister has tested positive for flu and strep throat.

## 2020-12-12 ENCOUNTER — Encounter: Payer: Self-pay | Admitting: Family Medicine

## 2020-12-12 LAB — CULTURE, GROUP A STREP (THRC)

## 2020-12-12 LAB — COVID-19, FLU A+B NAA
Influenza A, NAA: NOT DETECTED
Influenza B, NAA: NOT DETECTED
SARS-CoV-2, NAA: NOT DETECTED

## 2020-12-13 ENCOUNTER — Other Ambulatory Visit: Payer: Self-pay | Admitting: Nurse Practitioner

## 2020-12-13 MED ORDER — AZITHROMYCIN 250 MG PO TABS: 250.0000 mg | ORAL_TABLET | Freq: Every day | ORAL | 0 refills | Status: DC

## 2020-12-15 ENCOUNTER — Other Ambulatory Visit: Payer: Self-pay | Admitting: Family Medicine

## 2020-12-16 ENCOUNTER — Other Ambulatory Visit: Payer: Self-pay | Admitting: Family Medicine

## 2020-12-16 ENCOUNTER — Encounter: Payer: Self-pay | Admitting: Family Medicine

## 2020-12-17 MED ORDER — ALBUTEROL SULFATE HFA 108 (90 BASE) MCG/ACT IN AERS: 2.0000 | INHALATION_SPRAY | RESPIRATORY_TRACT | 2 refills | Status: DC | PRN

## 2021-01-02 ENCOUNTER — Telehealth: Payer: Self-pay | Admitting: Family Medicine

## 2021-01-02 NOTE — Telephone Encounter (Signed)
Pt states a virtual visit is fine if OK with provider.

## 2021-01-02 NOTE — Telephone Encounter (Signed)
Pt having headaches only on left side of head. Pt states they come and go and only last about 15-20 minutes. First one was earlier this week. Some come on at night and wake him up and then some come on during the day. No other symptoms. Please advise (since schedule is full). Thank you

## 2021-01-03 ENCOUNTER — Other Ambulatory Visit: Payer: Self-pay

## 2021-01-03 ENCOUNTER — Ambulatory Visit (INDEPENDENT_AMBULATORY_CARE_PROVIDER_SITE_OTHER): Payer: No Typology Code available for payment source | Admitting: Family Medicine

## 2021-01-03 VITALS — BP 151/91 | HR 51 | Temp 98.2°F | Wt 229.0 lb

## 2021-01-03 DIAGNOSIS — R519 Headache, unspecified: Secondary | ICD-10-CM

## 2021-01-03 MED ORDER — INDOMETHACIN 25 MG PO CAPS: ORAL_CAPSULE | ORAL | 0 refills | Status: DC

## 2021-01-03 NOTE — Telephone Encounter (Signed)
Pt accepted 4:30 appt today.

## 2021-01-03 NOTE — Telephone Encounter (Signed)
Patient could be given 430 appointment today Otherwise I do not have availability today but could be given an open slot for next week but that would be the patient's choice

## 2021-01-03 NOTE — Progress Notes (Signed)
   Subjective:    Patient ID: Mike Harvey, male    DOB: [birth year], 35 y.o.   MRN: [id number]  HPI  Intense pain No nausea no vomiting 17,18,24,26,28,29 Headaches mainly waking up at night lasting 10 to 20 mins started 6/ 17 thru 6/29 Behind L eye , face, and forehead   He used to have some headaches when he was much younger but has not had any problems recently abusive come out of the blue he denies having any type of sharp lancing pain no watering of the eye he relates it more as an intense pain on the left side of his head that goes behind his left will last anywhere from 10 to 20 minutes and get to the point where it is very uncomfortable Review of Systems     Objective:   Physical Exam General-in no acute distress Eyes-no discharge Lungs-respiratory rate normal, CTA CV-no murmurs,RRR Extremities skin warm dry no edema Neuro grossly normal Behavior normal, alert        Assessment & Plan:  1. Nocturnal headaches This is new onset headaches with nocturnal headaches needs rapid MRI of the brain as well as lab work.  I do not feel the patient needs to go to the emergency department currently but certainly if his condition worsens over the weekend with severe pain vomiting blurred vision weakness you should go to the ER for a stat scan otherwise we will get this completed next week hopefully depending on hospital schedule  Indomethacin for headache as needed - MR Brain Wo Contrast - Sed Rate (ESR) - C-reactive protein - Comprehensive metabolic panel - CBC with Differential/Platelet

## 2021-01-08 ENCOUNTER — Telehealth: Payer: Self-pay | Admitting: Family Medicine

## 2021-01-08 LAB — CBC WITH DIFFERENTIAL/PLATELET
Basophils Absolute: 0 10*3/uL (ref 0.0–0.2)
Basos: 0 %
EOS (ABSOLUTE): 0 10*3/uL (ref 0.0–0.4)
Eos: 1 %
Hematocrit: 46.6 % (ref 37.5–51.0)
Hemoglobin: 16.1 g/dL (ref 13.0–17.7)
Immature Grans (Abs): 0 10*3/uL (ref 0.0–0.1)
Immature Granulocytes: 0 %
Lymphocytes Absolute: 2.5 10*3/uL (ref 0.7–3.1)
Lymphs: 30 %
MCH: 32.7 pg (ref 26.6–33.0)
MCHC: 34.5 g/dL (ref 31.5–35.7)
MCV: 95 fL (ref 79–97)
Monocytes Absolute: 0.9 10*3/uL (ref 0.1–0.9)
Monocytes: 11 %
Neutrophils Absolute: 4.8 10*3/uL (ref 1.4–7.0)
Neutrophils: 58 %
Platelets: 345 10*3/uL (ref 150–450)
RBC: 4.92 x10E6/uL (ref 4.14–5.80)
RDW: 13.7 % (ref 11.6–15.4)
WBC: 8.2 10*3/uL (ref 3.4–10.8)

## 2021-01-08 LAB — COMPREHENSIVE METABOLIC PANEL
ALT: 60 IU/L — ABNORMAL HIGH (ref 0–44)
AST: 32 IU/L (ref 0–40)
Albumin/Globulin Ratio: 1.5 (ref 1.2–2.2)
Albumin: 4.6 g/dL (ref 4.0–5.0)
Alkaline Phosphatase: 92 IU/L (ref 44–121)
BUN/Creatinine Ratio: 16 (ref 9–20)
BUN: 18 mg/dL (ref 6–20)
Bilirubin Total: 0.6 mg/dL (ref 0.0–1.2)
CO2: 22 mmol/L (ref 20–29)
Calcium: 9.7 mg/dL (ref 8.7–10.2)
Chloride: 100 mmol/L (ref 96–106)
Creatinine, Ser: 1.15 mg/dL (ref 0.76–1.27)
Globulin, Total: 3 g/dL (ref 1.5–4.5)
Glucose: 59 mg/dL — ABNORMAL LOW (ref 65–99)
Potassium: 4.2 mmol/L (ref 3.5–5.2)
Sodium: 138 mmol/L (ref 134–144)
Total Protein: 7.6 g/dL (ref 6.0–8.5)
eGFR: 86 mL/min/{1.73_m2} (ref >59)

## 2021-01-08 LAB — SEDIMENTATION RATE: Sed Rate: 27 mm/hr — ABNORMAL HIGH (ref 0–15)

## 2021-01-08 LAB — C-REACTIVE PROTEIN: CRP: 2 mg/L (ref 0–10)

## 2021-01-08 NOTE — Telephone Encounter (Signed)
Toni Amend, is the precert done for the MRI. If so I could schedule the MRI.

## 2021-01-08 NOTE — Telephone Encounter (Signed)
Nurses Patient was seen recently with severe headaches please see last documentation of office visit MRI was ordered Please follow through on this week to make sure that the MRI does get scheduled thank you

## 2021-01-09 ENCOUNTER — Other Ambulatory Visit: Payer: Self-pay

## 2021-01-09 ENCOUNTER — Encounter: Payer: Self-pay | Admitting: Family Medicine

## 2021-01-09 DIAGNOSIS — R519 Headache, unspecified: Secondary | ICD-10-CM

## 2021-01-09 DIAGNOSIS — R748 Abnormal levels of other serum enzymes: Secondary | ICD-10-CM

## 2021-01-09 NOTE — Progress Notes (Signed)
Patient informed per lab result notes and recommendations.

## 2021-01-10 ENCOUNTER — Telehealth: Payer: Self-pay | Admitting: Family Medicine

## 2021-01-10 NOTE — Telephone Encounter (Signed)
This date should be fine he will be more than 10 days out from being positive for COVID

## 2021-01-10 NOTE — Telephone Encounter (Signed)
Patient has been advised per drs notes and recommendations, he verbalizes understanding.

## 2021-01-10 NOTE — Telephone Encounter (Signed)
He must be 10 days out from when he first started having symptoms before he can go do imaging or labs

## 2021-01-10 NOTE — Telephone Encounter (Signed)
Patient sent my chart message yesterday and did home test this morning and was positive. He want to know what to do since he has some test coming up and labs to be done. Please advise

## 2021-01-22 ENCOUNTER — Ambulatory Visit (HOSPITAL_COMMUNITY)
Admission: RE | Admit: 2021-01-22 | Discharge: 2021-01-22 | Disposition: A | Discharge status: Home / Self Care | Payer: No Typology Code available for payment source | Source: Ambulatory Visit | Attending: Family Medicine | Admitting: Family Medicine

## 2021-01-22 ENCOUNTER — Other Ambulatory Visit: Payer: Self-pay

## 2021-01-22 DIAGNOSIS — R519 Headache, unspecified: Secondary | ICD-10-CM | POA: Diagnosis present

## 2021-01-22 DIAGNOSIS — R748 Abnormal levels of other serum enzymes: Secondary | ICD-10-CM | POA: Insufficient documentation

## 2021-02-04 ENCOUNTER — Ambulatory Visit (INDEPENDENT_AMBULATORY_CARE_PROVIDER_SITE_OTHER): Payer: No Typology Code available for payment source | Admitting: Family Medicine

## 2021-02-04 ENCOUNTER — Other Ambulatory Visit: Payer: Self-pay

## 2021-02-04 VITALS — BP 132/88 | HR 87 | Temp 98.0°F | Ht 67.0 in | Wt 226.0 lb

## 2021-02-04 DIAGNOSIS — R748 Abnormal levels of other serum enzymes: Secondary | ICD-10-CM | POA: Diagnosis not present

## 2021-02-04 DIAGNOSIS — K760 Fatty (change of) liver, not elsewhere classified: Secondary | ICD-10-CM

## 2021-02-04 DIAGNOSIS — R0981 Nasal congestion: Secondary | ICD-10-CM

## 2021-02-04 DIAGNOSIS — J341 Cyst and mucocele of nose and nasal sinus: Secondary | ICD-10-CM | POA: Insufficient documentation

## 2021-02-04 DIAGNOSIS — R7989 Other specified abnormal findings of blood chemistry: Secondary | ICD-10-CM

## 2021-02-04 DIAGNOSIS — R519 Headache, unspecified: Secondary | ICD-10-CM | POA: Diagnosis not present

## 2021-02-04 LAB — ANTI-SMOOTH MUSCLE ANTIBODY, IGG: Smooth Muscle Ab: 21 Units — ABNORMAL HIGH (ref 0–19)

## 2021-02-04 LAB — CERULOPLASMIN: Ceruloplasmin: 21.7 mg/dL (ref 16.0–31.0)

## 2021-02-04 LAB — HEPATITIS B SURFACE ANTIGEN: Hepatitis B Surface Ag: NEGATIVE

## 2021-02-04 LAB — FERRITIN: Ferritin: 513 ng/mL — ABNORMAL HIGH (ref 30–400)

## 2021-02-04 LAB — HEPATITIS C ANTIBODY: Hep C Virus Ab: 0.2 s/co ratio (ref 0.0–0.9)

## 2021-02-04 MED ORDER — SUMATRIPTAN SUCCINATE 50 MG PO TABS: 50.0000 mg | ORAL_TABLET | ORAL | 0 refills | Status: DC | PRN

## 2021-02-04 NOTE — Patient Instructions (Signed)
https://www.nhlbi.nih.gov/files/docs/public/heart/dash_brief.pdf">  DASH Eating Plan DASH stands for Dietary Approaches to Stop Hypertension. The DASH eating plan is a healthy eating plan that has been shown to: Reduce high blood pressure (hypertension). Reduce your risk for type 2 diabetes, heart disease, and stroke. Help with weight loss. What are tips for following this plan? Reading food labels Check food labels for the amount of salt (sodium) per serving. Choose foods with less than 5 percent of the Daily Value of sodium. Generally, foods with less than 300 milligrams (mg) of sodium per serving fit into this eating plan. To find whole grains, look for the word "whole" as the first word in the ingredient list. Shopping Buy products labeled as "low-sodium" or "no salt added." Buy fresh foods. Avoid canned foods and pre-made or frozen meals. Cooking Avoid adding salt when cooking. Use salt-free seasonings or herbs instead of table salt or sea salt. Check with your health care provider or pharmacist before using salt substitutes. Do not fry foods. Cook foods using healthy methods such as baking, boiling, grilling, roasting, and broiling instead. Cook with heart-healthy oils, such as olive, canola, avocado, soybean, or sunflower oil. Meal planning  Eat a balanced diet that includes: 4 or more servings of fruits and 4 or more servings of vegetables each day. Try to fill one-half of your plate with fruits and vegetables. 6-8 servings of whole grains each day. Less than 6 oz (170 g) of lean meat, poultry, or fish each day. A 3-oz (85-g) serving of meat is about the same size as a deck of cards. One egg equals 1 oz (28 g). 2-3 servings of low-fat dairy each day. One serving is 1 cup (237 mL). 1 serving of nuts, seeds, or beans 5 times each week. 2-3 servings of heart-healthy fats. Healthy fats called omega-3 fatty acids are found in foods such as walnuts, flaxseeds, fortified milks, and eggs.  These fats are also found in cold-water fish, such as sardines, salmon, and mackerel. Limit how much you eat of: Canned or prepackaged foods. Food that is high in trans fat, such as some fried foods. Food that is high in saturated fat, such as fatty meat. Desserts and other sweets, sugary drinks, and other foods with added sugar. Full-fat dairy products. Do not salt foods before eating. Do not eat more than 4 egg yolks a week. Try to eat at least 2 vegetarian meals a week. Eat more home-cooked food and less restaurant, buffet, and fast food.  Lifestyle When eating at a restaurant, ask that your food be prepared with less salt or no salt, if possible. If you drink alcohol: Limit how much you use to: 0-1 drink a day for women who are not pregnant. 0-2 drinks a day for men. Be aware of how much alcohol is in your drink. In the [place] one drink equals one 12 oz bottle of beer (355 mL), one 5 oz glass of wine (148 mL), or one 1 oz glass of hard liquor (44 mL). General information Avoid eating more than 2,300 mg of salt a day. If you have hypertension, you may need to reduce your sodium intake to 1,500 mg a day. Work with your health care provider to maintain a healthy body weight or to lose weight. Ask what an ideal weight is for you. Get at least 30 minutes of exercise that causes your heart to beat faster (aerobic exercise) most days of the week. Activities may include walking, swimming, or biking. Work with your health care provider   or dietitian to adjust your eating plan to your individual calorie needs. What foods should I eat? Fruits All fresh, dried, or frozen fruit. Canned fruit in natural juice (without addedsugar). Vegetables Fresh or frozen vegetables (raw, steamed, roasted, or grilled). Low-sodium or reduced-sodium tomato and vegetable juice. Low-sodium or reduced-sodium tomatosauce and tomato paste. Low-sodium or reduced-sodium canned vegetables. Grains Whole-grain or  whole-wheat bread. Whole-grain or whole-wheat pasta. Brown rice. Oatmeal. Quinoa. Bulgur. Whole-grain and low-sodium cereals. Pita bread.Low-fat, low-sodium crackers. Whole-wheat flour tortillas. Meats and other proteins Skinless chicken or turkey. Ground chicken or turkey. Pork with fat trimmed off. Fish and seafood. Egg whites. Dried beans, peas, or lentils. Unsalted nuts, nut butters, and seeds. Unsalted canned beans. Lean cuts of beef with fat trimmed off. Low-sodium, lean precooked or cured meat, such as sausages or meatloaves. Dairy Low-fat (1%) or fat-free (skim) milk. Reduced-fat, low-fat, or fat-free cheeses. Nonfat, low-sodium ricotta or cottage cheese. Low-fat or nonfatyogurt. Low-fat, low-sodium cheese. Fats and oils Soft margarine without trans fats. Vegetable oil. Reduced-fat, low-fat, or light mayonnaise and salad dressings (reduced-sodium). Canola, safflower, olive, avocado, soybean, andsunflower oils. Avocado. Seasonings and condiments Herbs. Spices. Seasoning mixes without salt. Other foods Unsalted popcorn and pretzels. Fat-free sweets. The items listed above may not be a complete list of foods and beverages you can eat. Contact a dietitian for more information. What foods should I avoid? Fruits Canned fruit in a light or heavy syrup. Fried fruit. Fruit in cream or buttersauce. Vegetables Creamed or fried vegetables. Vegetables in a cheese sauce. Regular canned vegetables (not low-sodium or reduced-sodium). Regular canned tomato sauce and paste (not low-sodium or reduced-sodium). Regular tomato and vegetable juice(not low-sodium or reduced-sodium). Pickles. Olives. Grains Baked goods made with fat, such as croissants, muffins, or some breads. Drypasta or rice meal packs. Meats and other proteins Fatty cuts of meat. Ribs. Fried meat. Bacon. Bologna, salami, and other precooked or cured meats, such as sausages or meat loaves. Fat from the back of a pig (fatback). Bratwurst.  Salted nuts and seeds. Canned beans with added salt. Canned orsmoked fish. Whole eggs or egg yolks. Chicken or turkey with skin. Dairy Whole or 2% milk, cream, and half-and-half. Whole or full-fat cream cheese. Whole-fat or sweetened yogurt. Full-fat cheese. Nondairy creamers. Whippedtoppings. Processed cheese and cheese spreads. Fats and oils Butter. Stick margarine. Lard. Shortening. Ghee. Bacon fat. Tropical oils, suchas coconut, palm kernel, or palm oil. Seasonings and condiments Onion salt, garlic salt, seasoned salt, table salt, and sea salt. Worcestershire sauce. Tartar sauce. Barbecue sauce. Teriyaki sauce. Soy sauce, including reduced-sodium. Steak sauce. Canned and packaged gravies. Fish sauce. Oyster sauce. Cocktail sauce. Store-bought horseradish. Ketchup. Mustard. Meat flavorings and tenderizers. Bouillon cubes. Hot sauces. Pre-made or packaged marinades. Pre-made or packaged taco seasonings. Relishes. Regular saladdressings. Other foods Salted popcorn and pretzels. The items listed above may not be a complete list of foods and beverages you should avoid. Contact a dietitian for more information. Where to find more information National Heart, Lung, and Blood Institute: www.nhlbi.nih.gov American Heart Association: www.heart.org Academy of Nutrition and Dietetics: www.eatright.org National Kidney Foundation: www.kidney.org Summary The DASH eating plan is a healthy eating plan that has been shown to reduce high blood pressure (hypertension). It may also reduce your risk for type 2 diabetes, heart disease, and stroke. When on the DASH eating plan, aim to eat more fresh fruits and vegetables, whole grains, lean proteins, low-fat dairy, and heart-healthy fats. With the DASH eating plan, you should limit salt (sodium) intake to 2,300   mg a day. If you have hypertension, you may need to reduce your sodium intake to 1,500 mg a day. Work with your health care provider or dietitian to adjust  your eating plan to your individual calorie needs. This information is not intended to replace advice given to you by your health care provider. Make sure you discuss any questions you have with your healthcare provider. Document Revised: 05/26/2019 Document Reviewed: 05/26/2019 Elsevier Patient Education  2022 Elsevier Inc.  

## 2021-02-04 NOTE — Progress Notes (Signed)
   Subjective:    Patient ID: Mike Harvey, male    DOB: [birth year], 35 y.o.   MRN: [id number]  HPI Headaches follow up - last on 01/20/21 he states the headaches actually went away. Has not had any since July 18 Describes it more as a burning pain and throbbing pain on the left side of his head that goes behind the eye It is possible may have been cluster headaches He denies any watery eyes  He did have an MRI that showed a nonspecific area It also showed retention cyst in the sinus In addition to this had lab work because of elevated liver enzymes ferritin elevated lab work shows fatty liver ultrasound also shows fatty liver Review of Systems     Objective:   Physical Exam  General-in no acute distress Eyes-no discharge Lungs-respiratory rate normal, CTA CV-no murmurs,RRR Extremities skin warm dry no edema Neuro grossly normal Behavior normal, alert  Mild obesity     Assessment & Plan:  1. Nocturnal headaches It is possible these could be cluster headaches Try Imitrex at the first sign of the headache if that does not work may repeat in 2 hours if still has work May use indomethacin keep a headache diary sent to [place] in 4 to 6 weeks follow-up within 4 months  2. Elevated liver enzymes Healthy diet regular activity recommended patient does not drink lab work reviewed with patient smooth muscle antibody minimally elevated.  Ferritin moderately elevated.  Will get gastroenterology consultation  3. Sinus congestion Retention cyst on MRI patient may well benefit from seeing ENT he will consider it   4. Retention cyst of nasal sinus See above  5. Elevated ferritin With this being elevated minimize meat recommend chicken [place] fish recommend limiting to 6 ounces or less per meal watch portions exercise try to lose weight also consider giving blood 3-4 times per year recommend gastroenterology consult patient states he will call us back with his preference  6. Fatty  liver See above  Follow-up in 4 months sooner if problems

## 2021-02-05 NOTE — Progress Notes (Signed)
MyChart message sent to patient.

## 2021-02-06 NOTE — Progress Notes (Signed)
02/06/21- referral placed to Dr.Rehman

## 2021-02-06 NOTE — Addendum Note (Signed)
Addended by: Marlowe Shores on: 02/06/2021 08:18 AM   Modules accepted: Orders

## 2021-02-18 ENCOUNTER — Encounter (INDEPENDENT_AMBULATORY_CARE_PROVIDER_SITE_OTHER): Payer: Self-pay | Admitting: *Deleted

## 2021-04-28 ENCOUNTER — Ambulatory Visit: Payer: No Typology Code available for payment source | Admitting: Family Medicine

## 2021-06-26 ENCOUNTER — Ambulatory Visit (INDEPENDENT_AMBULATORY_CARE_PROVIDER_SITE_OTHER): Payer: No Typology Code available for payment source | Admitting: Gastroenterology

## 2021-06-26 ENCOUNTER — Other Ambulatory Visit: Payer: Self-pay

## 2021-06-26 ENCOUNTER — Encounter (INDEPENDENT_AMBULATORY_CARE_PROVIDER_SITE_OTHER): Payer: Self-pay | Admitting: Gastroenterology

## 2021-06-26 DIAGNOSIS — K7581 Nonalcoholic steatohepatitis (NASH): Secondary | ICD-10-CM

## 2021-06-26 DIAGNOSIS — R768 Other specified abnormal immunological findings in serum: Secondary | ICD-10-CM | POA: Diagnosis not present

## 2021-06-26 DIAGNOSIS — R7989 Other specified abnormal findings of blood chemistry: Secondary | ICD-10-CM | POA: Insufficient documentation

## 2021-06-26 NOTE — Patient Instructions (Signed)
Perform blood workup Explained to the patient the etiology and consequences of his current liver disease. Patient was counseled about the benefit of implementing a Mediterranean diet and exercise plan to decrease at least 5% of weight. The patient understood about the importance of lifestyle changes to potentially reverse his liver involvement. 

## 2021-06-26 NOTE — Progress Notes (Signed)
Mike Harvey, M.D. Gastroenterology & Hepatology [facility] [address] [place], [place] [address] Primary Care Physician: Mike Sciara, MD [address] [place] [address]  Referring MD: PCP  Chief Complaint: Elevated liver enzymes  History of Present Illness: Mike Harvey is a 35 y.o. male with PMH asthma who presents for evaluation of elevated liver enzymes.  Patient was referred to our clinic for evaluation of elevated liver enzymes.  He denies having any complaints at the moment.  The patient denies having any nausea, vomiting, fever, chills, hematochezia, melena, hematemesis, abdominal distention, abdominal pain, diarrhea, jaundice, pruritus. Since early 2020, he lost significant amount of lb as he was very active but hs gained it back.   Takes Advil very infrequently. Takes Tylenol more recently couple of times a month. Does not take any supplements or herbal medicines.  Upon review of his medical chart, patient was noted to have elevated aminotransferases since 2016 in our records with ALT of 49 and AST of 28, rest of liver function test were within normal limits at that time.  Liver enzymes have remained elevated since then with most recent labs from 01/07/2021 that showed ALT of 60, AST of 32, albumin 4.6, total bilirubin 0.6, alkaline phosphatase 92, normal electrolytes, creatinine 1.15 and BUN 18, CBC with white blood cell count 8.2, hemoglobin 16.1 and platelets of 345.  As part of the evaluation of the elevated liver enzymes, he had mildly elevated anti-smooth muscle antibodies with a titer of 21, ceruloplasmin was normal 21, ferritin was elevated 513, had a negative hepatitis B surface antigen, negative hepatitis C antibody.  Underwent a liver ultrasound on 01/22/2021 that showed presence of hepatic steatosis.  Last FYB:OFBPZ Last Colonoscopy:never  FHx: neg for any gastrointestinal/liver disease, no  malignancies Social: neg smoking, alcohol or illicit drug use Surgical: no abdominal surgeries  Past Medical History: Past Medical History:  Diagnosis Date   Asthma    White coat hypertension     Past Surgical History:History reviewed. No pertinent surgical history.  Family History: Family History  Problem Relation Age of Onset   Kidney disease Mother    Cancer Maternal Grandfather        lung cancer; smoker    Social History: Social History   Tobacco Use  Smoking Status Never  Smokeless Tobacco Never   Social History   Substance and Sexual Activity  Alcohol Use No   Alcohol/week: 0.0 standard drinks   Social History   Substance and Sexual Activity  Drug Use No    Allergies: No Known Allergies  Medications: Current Outpatient Medications  Medication Sig Dispense Refill   albuterol (VENTOLIN HFA) 108 (90 Base) MCG/ACT inhaler Inhale 2 puffs into the lungs every 4 (four) hours as needed. 1 each 2   cetirizine (ZYRTEC) 10 MG tablet Take 10 mg by mouth daily.     ibuprofen (ADVIL) 800 MG tablet Take 1 tablet (800 mg total) by mouth 3 (three) times daily. Take with food 60 tablet 0   SUMAtriptan (IMITREX) 50 MG tablet Take 1 tablet (50 mg total) by mouth every 2 (two) hours as needed for migraine. May repeat in 2 hours if headache persists or recurs. 10 tablet 0   indomethacin (INDOCIN) 25 MG capsule One tid prn headache (Patient not taking: Reported on 06/26/2021) 24 capsule 0   No current facility-administered medications for this visit.    Review of Systems: GENERAL: negative for malaise, night sweats HEENT:  No changes in hearing or vision, no nose bleeds or other nasal problems. NECK: Negative for lumps, goiter, pain and significant neck swelling RESPIRATORY: Negative for cough, wheezing CARDIOVASCULAR: Negative for chest pain, leg swelling, palpitations, orthopnea GI: SEE HPI MUSCULOSKELETAL: Negative for joint pain or swelling, back pain, and muscle  pain. SKIN: Negative for lesions, rash PSYCH: Negative for sleep disturbance, mood disorder and recent psychosocial stressors. HEMATOLOGY Negative for prolonged bleeding, bruising easily, and swollen nodes. ENDOCRINE: Negative for cold or heat intolerance, polyuria, polydipsia and goiter. NEURO: negative for tremor, gait imbalance, syncope and seizures. The remainder of the review of systems is noncontributory.   Physical Exam: BP (!) 163/105 (BP Location: Left Arm, Patient Position: Sitting, Cuff Size: Large)    Pulse 74    Temp (!) 97.5 F (36.4 C) (Oral)    Ht 5' 6.5" (1.689 m)    Wt 220 lb 11.2 oz (100.1 kg)    BMI 35.09 kg/m  GENERAL: The patient is AO x3, in no acute distress. Obese. HEENT: Head is normocephalic and atraumatic. EOMI are intact. Mouth is well hydrated and without lesions. NECK: Supple. No masses LUNGS: Clear to auscultation. No presence of rhonchi/wheezing/rales. Adequate chest expansion HEART: RRR, normal s1 and s2. ABDOMEN: Soft, nontender, no guarding, no peritoneal signs, and nondistended. BS +. No masses. EXTREMITIES: Without any cyanosis, clubbing, rash, lesions or edema. NEUROLOGIC: AOx3, no focal motor deficit. SKIN: no jaundice, no rashes   Imaging/Labs: as above  I personally reviewed and interpreted the available labs, imaging and endoscopic files.  Impression and Plan: Mike Harvey is a 35 y.o. male with PMH asthma who presents for evaluation of elevated liver enzymes.  The patient has presented chronically mildly elevated aminotransferases.  Differential diagnosis for his mild elevation is brought but likely is related to NASH, although he had mildly elevated titers for AIH.  We will obtain further serologies for autoimmune hepatitis, but we will also check viral hepatitis serologies, as well as markers for hemochromatosis.  He was advised to implement the Mediterranean diet to help with weight loss which may also help for his NASH.  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  - Check IgG, ASMA, ANA, CMP, iron studies, hepatitis serologies - Explained to the patient the etiology and consequences of his current liver disease. Patient was counseled about the benefit of implementing a Mediterranean diet and exercise plan to decrease at least 5% of weight. The patient understood about the importance of lifestyle changes to potentially reverse his liver involvement.  All questions were answered.      Mike Blazing, MD Gastroenterology and Hepatology [facility]

## 2021-07-01 LAB — HEPATITIS A ANTIBODY, TOTAL: Hepatitis A AB,Total: NONREACTIVE

## 2021-07-01 LAB — HEPATITIS PANEL, ACUTE
Hep A IgM: NONREACTIVE
Hep B C IgM: NONREACTIVE
Hepatitis B Surface Ag: NONREACTIVE
Hepatitis C Ab: NONREACTIVE
SIGNAL TO CUT-OFF: 0.07 (ref <1.00)

## 2021-07-01 LAB — COMPREHENSIVE METABOLIC PANEL
AG Ratio: 1.4 (calc) (ref 1.0–2.5)
ALT: 49 U/L — ABNORMAL HIGH (ref 9–46)
AST: 29 U/L (ref 10–40)
Albumin: 4.5 g/dL (ref 3.6–5.1)
Alkaline phosphatase (APISO): 84 U/L (ref 36–130)
BUN: 16 mg/dL (ref 7–25)
CO2: 23 mmol/L (ref 20–32)
Calcium: 9.7 mg/dL (ref 8.6–10.3)
Chloride: 105 mmol/L (ref 98–110)
Creat: 1.13 mg/dL (ref 0.60–1.26)
Globulin: 3.2 g/dL (calc) (ref 1.9–3.7)
Glucose, Bld: 80 mg/dL (ref 65–139)
Potassium: 4.1 mmol/L (ref 3.5–5.3)
Sodium: 138 mmol/L (ref 135–146)
Total Bilirubin: 0.7 mg/dL (ref 0.2–1.2)
Total Protein: 7.7 g/dL (ref 6.1–8.1)

## 2021-07-01 LAB — IRON, TOTAL/TOTAL IRON BINDING CAP
%SAT: 39 % (calc) (ref 20–48)
Iron: 145 ug/dL (ref 50–180)
TIBC: 371 mcg/dL (calc) (ref 250–425)

## 2021-07-01 LAB — IGG: IgG (Immunoglobin G), Serum: 1454 mg/dL (ref 600–1640)

## 2021-07-01 LAB — ANA: Anti Nuclear Antibody (ANA): NEGATIVE

## 2021-07-01 LAB — ANTI-SMOOTH MUSCLE ANTIBODY, IGG: Actin (Smooth Muscle) Antibody (IGG): <20 U (ref <20)

## 2021-11-28 ENCOUNTER — Telehealth: Payer: No Typology Code available for payment source | Admitting: Emergency Medicine

## 2021-11-28 DIAGNOSIS — J069 Acute upper respiratory infection, unspecified: Secondary | ICD-10-CM | POA: Diagnosis not present

## 2021-11-28 MED ORDER — FLUTICASONE PROPIONATE 50 MCG/ACT NA SUSP: 2.0000 | Freq: Every day | NASAL | 0 refills | Status: DC

## 2021-11-28 MED ORDER — BENZONATATE 100 MG PO CAPS: 100.0000 mg | ORAL_CAPSULE | Freq: Two times a day (BID) | ORAL | 0 refills | Status: DC | PRN

## 2021-11-28 NOTE — Progress Notes (Signed)
E-Visit for Upper Respiratory Infection   We are sorry you are not feeling well.  Here is how we plan to help!  Based on what you have shared with me, it looks like you may have a viral upper respiratory infection.  Upper respiratory infections are caused by a large number of viruses; however, rhinovirus is the most common cause.   Symptoms vary from person to person, with common symptoms including sore throat, cough, fatigue or lack of energy and feeling of general discomfort.  A low-grade fever of up to 100.4 may present, but is often uncommon.  Symptoms vary however, and are closely related to a person's age or underlying illnesses.  The most common symptoms associated with an upper respiratory infection are nasal discharge or congestion, cough, sneezing, headache and pressure in the ears and face.  These symptoms usually persist for about 3 to 10 days, but can last up to 2 weeks.  It is important to know that upper respiratory infections do not cause serious illness or complications in most cases.  Antibiotics are not recommended  unless you have severe symptoms (including high fever) or you have symptoms for more than 10 days. If you still have symptoms after 10 days, antibiotics should be considered.    Upper respiratory infections can be transmitted from person to person, with the most common method of transmission being a person's hands.  The virus is able to live on the skin and can infect other persons for up to 2 hours after direct contact.  Also, these can be transmitted when someone coughs or sneezes; thus, it is important to cover the mouth to reduce this risk.  To keep the spread of the illness at [place], good hand hygiene is very important.  This is an infection that is most likely caused by a virus. There are no specific treatments other than to help you with the symptoms until the infection runs its course.  We are sorry you are not feeling well.  Here is how we plan to help!   For nasal  congestion, you may use an oral decongestants such as Mucinex D or if you have glaucoma or high blood pressure use plain Mucinex.    Saline nasal spray or nasal drops can help and can safely be used as often as needed for congestion.  Try using saline irrigation, such as with a neti pot, several times a day while you are sick. Many neti pots come with salt packets premeasured to use to make saline. If you use your own salt, make sure it is kosher salt or sea salt (don't use table salt as it has iodine in it and you don't need that in your nose). Use distilled water to make saline. If you mix your own saline using your own salt, the recipe is 1/4 teaspoon salt in 1 cup warm water. Using saline irrigation can help prevent and treat sinus infections.   For your congestion, I have prescribed Fluticasone nasal spray one spray in each nostril twice a day  If you do not have a history of heart disease, hypertension, diabetes or thyroid disease, prostate/bladder issues or glaucoma, you may also use Sudafed to treat nasal congestion.  It is highly recommended that you consult with a pharmacist or your primary care physician to ensure this medication is safe for you to take.     If you have a cough, you may use cough suppressants such as Delsym and Robitussin.  If you have glaucoma  or high blood pressure, you can also use Coricidin HBP.   For cough I have prescribed for you A prescription cough medication called Tessalon Perles 100 mg. You may take 1-2 capsules every 8 hours as needed for cough  If you have a sore or scratchy throat, use a saltwater gargle-  to  teaspoon of salt dissolved in a 4-ounce to 8-ounce glass of warm water.  Gargle the solution for approximately 15-30 seconds and then spit.  It is important not to swallow the solution.  You can also use throat lozenges/cough drops and Chloraseptic spray to help with throat pain or discomfort.  Warm or cold liquids can also be helpful in relieving throat  pain.  For headache, pain or general discomfort, you can use Ibuprofen or Tylenol as directed.   Some authorities believe that zinc sprays or the use of Echinacea may shorten the course of your symptoms.   HOME CARE Only take medications as instructed by your medical team. Be sure to drink plenty of fluids. Water is fine as well as fruit juices, sodas and electrolyte beverages. You may want to stay away from caffeine or alcohol. If you are nauseated, try taking small sips of liquids. How do you know if you are getting enough fluid? Your urine should be a pale yellow or almost colorless. Get rest. Taking a steamy shower or using a humidifier may help nasal congestion and ease sore throat pain. You can place a towel over your head and breathe in the steam from hot water coming from a faucet. Using a saline nasal spray works much the same way. Cough drops, hard candies and sore throat lozenges may ease your cough. Avoid close contacts especially the very young and the elderly Cover your mouth if you cough or sneeze Always remember to wash your hands.   GET HELP RIGHT AWAY IF: You develop worsening fever. If your symptoms do not improve within 10 days You develop yellow or green discharge from your nose over 3 days. You have coughing fits You develop a severe head ache or visual changes. You develop shortness of breath, difficulty breathing or start having chest pain Your symptoms persist after you have completed your treatment plan  MAKE SURE YOU  Understand these instructions. Will watch your condition. Will get help right away if you are not doing well or get worse.  Thank you for choosing an e-visit.  Your e-visit answers were reviewed by a board certified advanced clinical practitioner to complete your personal care plan. Depending upon the condition, your plan could have included both over the counter or prescription medications.  Please review your pharmacy choice. Make sure the  pharmacy is open so you can pick up prescription now. If there is a problem, you may contact your provider through [employer] and have the prescription routed to another pharmacy.  Your safety is important to [place]. If you have drug allergies check your prescription carefully.   For the next 24 hours you can use MyChart to ask questions about today's visit, request a non-urgent call back, or ask for a work or school excuse. You will get an email in the next two days asking about your experience. I hope that your e-visit has been valuable and will speed your recovery.  I have spent 5 minutes in review of e-visit questionnaire, review and updating patient chart, medical decision making and response to patient.   Carvel Getting, NP

## 2021-12-25 ENCOUNTER — Ambulatory Visit (INDEPENDENT_AMBULATORY_CARE_PROVIDER_SITE_OTHER): Payer: No Typology Code available for payment source | Admitting: Gastroenterology

## 2021-12-25 ENCOUNTER — Encounter (INDEPENDENT_AMBULATORY_CARE_PROVIDER_SITE_OTHER): Payer: Self-pay | Admitting: Gastroenterology

## 2021-12-25 VITALS — BP 155/92 | HR 67 | Temp 98.8°F | Ht 60.0 in | Wt 226.8 lb

## 2021-12-25 DIAGNOSIS — K7581 Nonalcoholic steatohepatitis (NASH): Secondary | ICD-10-CM

## 2021-12-25 DIAGNOSIS — R7989 Other specified abnormal findings of blood chemistry: Secondary | ICD-10-CM

## 2021-12-25 NOTE — Progress Notes (Unsigned)
[name], M.D. Gastroenterology & Hepatology [facility] [address] [place], [place] [address]  Primary Care Physician: Babs Sciara, MD [address] [place] [address]  I will communicate my assessment and recommendations to the referring MD via EMR.  Problems: ***  History of Present Illness: Mike Harvey is a 36 y.o. male who presents for follow up of ***  The patient was last seen on ***. At that time, the patient ***.  Patient reports that he has tried to lose weight but has not lost too much unfortunately. Has tried to avoid fast food as much as possible as well as eating smaller portions and eating more chicken. Has tried to do some exercise . Has gained 6 lb since the last time he was seen in the office.  The patient denies having any nausea, vomiting, fever, chills, hematochezia, melena, hematemesis, abdominal distention, abdominal pain, diarrhea, jaundice, pruritus or weight loss.  Last HYW:VPXTG Last Colonoscopy:never  Past Medical History: Past Medical History:  Diagnosis Date   Asthma    White coat hypertension     Past Surgical History:History reviewed. No pertinent surgical history.  Family History: Family History  Problem Relation Age of Onset   Kidney disease Mother    Cancer Maternal Grandfather        lung cancer; smoker    Social History: Social History   Tobacco Use  Smoking Status Never  Smokeless Tobacco Never   Social History   Substance and Sexual Activity  Alcohol Use No   Alcohol/week: 0.0 standard drinks of alcohol   Social History   Substance and Sexual Activity  Drug Use No    Allergies: No Known Allergies  Medications: Current Outpatient Medications  Medication Sig Dispense Refill   albuterol (VENTOLIN HFA) 108 (90 Base) MCG/ACT inhaler Inhale 2 puffs into the lungs every 4 (four) hours as needed. 1 each 2   cetirizine (ZYRTEC) 10 MG  tablet Take 10 mg by mouth daily.     ibuprofen (ADVIL) 800 MG tablet Take 1 tablet (800 mg total) by mouth 3 (three) times daily. Take with food 60 tablet 0   SUMAtriptan (IMITREX) 50 MG tablet Take 1 tablet (50 mg total) by mouth every 2 (two) hours as needed for migraine. May repeat in 2 hours if headache persists or recurs. 10 tablet 0   No current facility-administered medications for this visit.    Review of Systems: GENERAL: negative for malaise, night sweats HEENT: No changes in hearing or vision, no nose bleeds or other nasal problems. NECK: Negative for lumps, goiter, pain and significant neck swelling RESPIRATORY: Negative for cough, wheezing CARDIOVASCULAR: Negative for chest pain, leg swelling, palpitations, orthopnea GI: SEE HPI MUSCULOSKELETAL: Negative for joint pain or swelling, back pain, and muscle pain. SKIN: Negative for lesions, rash PSYCH: Negative for sleep disturbance, mood disorder and recent psychosocial stressors. HEMATOLOGY Negative for prolonged bleeding, bruising easily, and swollen nodes. ENDOCRINE: Negative for cold or heat intolerance, polyuria, polydipsia and goiter. NEURO: negative for tremor, gait imbalance, syncope and seizures. The remainder of the review of systems is noncontributory.   Physical Exam: BP (!) 155/92 (BP Location: Left Arm, Patient Position: Sitting, Cuff Size: Large)   Pulse 67   Temp 98.8 F (37.1 C) (Oral)   Ht 5' (1.524 m)   Wt 226 lb 12.8 oz (102.9 kg)   BMI 44.29 kg/m  GENERAL: The patient is AO x3, in no acute distress. HEENT:  Head is normocephalic and atraumatic. EOMI are intact. Mouth is well hydrated and without lesions. NECK: Supple. No masses LUNGS: Clear to auscultation. No presence of rhonchi/wheezing/rales. Adequate chest expansion HEART: RRR, normal s1 and s2. ABDOMEN: Soft, nontender, no guarding, no peritoneal signs, and nondistended. BS +. No masses. RECTAL EXAM: no external lesions, normal tone, no  masses, brown stool without blood.*** Chaperone: EXTREMITIES: Without any cyanosis, clubbing, rash, lesions or edema. NEUROLOGIC: AOx3, no focal motor deficit. SKIN: no jaundice, no rashes  Imaging/Labs: as above  I personally reviewed and interpreted the available labs, imaging and endoscopic files.  Impression and Plan: Mike Harvey is a 36 y.o. male coming for follow up of ***   All questions were answered.      Dolores Frame, MD Gastroenterology and Hepatology [facility]

## 2021-12-25 NOTE — Patient Instructions (Signed)
Implement Mediterranean diet Perform blood workup  Mediterranean Diet A Mediterranean diet refers to food and lifestyle choices that are based on the traditions of countries located on the [employer]. It focuses on eating more fruits, vegetables, whole grains, beans, nuts, seeds, and heart-healthy fats, and eating less dairy, meat, eggs, and processed foods with added sugar, salt, and fat. This way of eating has been shown to help prevent certain conditions and improve outcomes for people who have chronic diseases, like kidney disease and heart disease. What are tips for following this plan? Reading food labels Check the serving size of packaged foods. For foods such as rice and pasta, the serving size refers to the amount of cooked product, not dry. Check the total fat in packaged foods. Avoid foods that have saturated fat or trans fats. Check the ingredient list for added sugars, such as corn syrup. Shopping  Buy a variety of foods that offer a balanced diet, including: Fresh fruits and vegetables (produce). Grains, beans, nuts, and seeds. Some of these may be available in unpackaged forms or large amounts (in bulk). Fresh seafood. Poultry and eggs. Low-fat dairy products. Buy whole ingredients instead of prepackaged foods. Buy fresh fruits and vegetables in-season from local farmers markets. Buy plain frozen fruits and vegetables. If you do not have access to quality fresh seafood, buy precooked frozen shrimp or canned fish, such as tuna, salmon, or sardines. Stock your pantry so you always have certain foods on hand, such as olive oil, canned tuna, canned tomatoes, rice, pasta, and beans. Cooking Cook foods with extra-virgin olive oil instead of using butter or other vegetable oils. Have meat as a side dish, and have vegetables or grains as your main dish. This means having meat in small portions or adding small amounts of meat to foods like pasta or stew. Use beans or vegetables  instead of meat in common dishes like chili or lasagna. Experiment with different cooking methods. Try roasting, broiling, steaming, and sauting vegetables. Add frozen vegetables to soups, stews, pasta, or rice. Add nuts or seeds for added healthy fats and plant protein at each meal. You can add these to yogurt, salads, or vegetable dishes. Marinate fish or vegetables using olive oil, lemon juice, garlic, and fresh herbs. Meal planning Plan to eat one vegetarian meal one day each week. Try to work up to two vegetarian meals, if possible. Eat seafood two or more times a week. Have healthy snacks readily available, such as: Vegetable sticks with hummus. Greek yogurt. Fruit and nut trail mix. Eat balanced meals throughout the week. This includes: Fruit: 2-3 servings a day. Vegetables: 4-5 servings a day. Low-fat dairy: 2 servings a day. Fish, poultry, or lean meat: 1 serving a day. Beans and legumes: 2 or more servings a week. Nuts and seeds: 1-2 servings a day. Whole grains: 6-8 servings a day. Extra-virgin olive oil: 3-4 servings a day. Limit red meat and sweets to only a few servings a month. Lifestyle  Cook and eat meals together with your family, when possible. Drink enough fluid to keep your urine pale yellow. Be physically active every day. This includes: Aerobic exercise like running or swimming. Leisure activities like gardening, walking, or housework. Get 7-8 hours of sleep each night. If recommended by your health care provider, drink red wine in moderation. This means 1 glass a day for nonpregnant women and 2 glasses a day for men. A glass of wine equals 5 oz (150 mL). What foods should I eat? Fruits  Apples. Apricots. Avocado. Berries. Bananas. Cherries. Dates. Figs. Grapes. Lemons. Melon. Oranges. Peaches. Plums. Pomegranate. Vegetables Artichokes. Beets. Broccoli. Cabbage. Carrots. Eggplant. Green beans. Chard. Kale. Spinach. Onions. Leeks. Peas. Squash. Tomatoes.  Peppers. Radishes. Grains Whole-grain pasta. Brown rice. Bulgur wheat. Polenta. Couscous. Whole-wheat bread. Orpah Cobb. Meats and other proteins Beans. Almonds. Sunflower seeds. Pine nuts. Peanuts. Cod. Salmon. Scallops. Shrimp. Tuna. Tilapia. Clams. Oysters. Eggs. Poultry without skin. Dairy Low-fat milk. Cheese. Greek yogurt. Fats and oils Extra-virgin olive oil. Avocado oil. Grapeseed oil. Beverages Water. Red wine. Herbal tea. Sweets and desserts Greek yogurt with honey. Baked apples. Poached pears. Trail mix. Seasonings and condiments Basil. Cilantro. Coriander. Cumin. Mint. Parsley. Sage. Rosemary. Tarragon. Garlic. Oregano. Thyme. Pepper. Balsamic vinegar. Tahini. Hummus. Tomato sauce. Olives. Mushrooms. The items listed above may not be a complete list of foods and beverages you can eat. Contact a dietitian for more information. What foods should I limit? This is a list of foods that should be eaten rarely or only on special occasions. Fruits Fruit canned in syrup. Vegetables Deep-fried potatoes (french fries). Grains Prepackaged pasta or rice dishes. Prepackaged cereal with added sugar. Prepackaged snacks with added sugar. Meats and other proteins Beef. Pork. Lamb. Poultry with skin. Hot dogs. Tomasa Blase. Dairy Ice cream. Sour cream. Whole milk. Fats and oils Butter. Canola oil. Vegetable oil. Beef fat (tallow). Lard. Beverages Juice. Sugar-sweetened soft drinks. Beer. Liquor and spirits. Sweets and desserts Cookies. Cakes. Pies. Candy. Seasonings and condiments Mayonnaise. Pre-made sauces and marinades. The items listed above may not be a complete list of foods and beverages you should limit. Contact a dietitian for more information. Summary The Mediterranean diet includes both food and lifestyle choices. Eat a variety of fresh fruits and vegetables, beans, nuts, seeds, and whole grains. Limit the amount of red meat and sweets that you eat. If recommended by your  health care provider, drink red wine in moderation. This means 1 glass a day for nonpregnant women and 2 glasses a day for men. A glass of wine equals 5 oz (150 mL). This information is not intended to replace advice given to you by your health care provider. Make sure you discuss any questions you have with your health care provider. Document Revised: 07/28/2019 Document Reviewed: 05/25/2019 Elsevier Patient Education  2023 [employer].

## 2021-12-26 LAB — CBC WITH DIFFERENTIAL/PLATELET
Absolute Monocytes: 737 cells/uL (ref 200–950)
Basophils Absolute: 23 cells/uL (ref 0–200)
Basophils Relative: 0.3 %
Eosinophils Absolute: 53 cells/uL (ref 15–500)
Eosinophils Relative: 0.7 %
HCT: 47.7 % (ref 38.5–50.0)
Hemoglobin: 16.4 g/dL (ref 13.2–17.1)
Lymphs Abs: 2075 cells/uL (ref 850–3900)
MCH: 32.8 pg (ref 27.0–33.0)
MCHC: 34.4 g/dL (ref 32.0–36.0)
MCV: 95.4 fL (ref 80.0–100.0)
MPV: 10 fL (ref 7.5–12.5)
Monocytes Relative: 9.7 %
Neutro Abs: 4712 cells/uL (ref 1500–7800)
Neutrophils Relative %: 62 %
Platelets: 356 10*3/uL (ref 140–400)
RBC: 5 10*6/uL (ref 4.20–5.80)
RDW: 13 % (ref 11.0–15.0)
Total Lymphocyte: 27.3 %
WBC: 7.6 10*3/uL (ref 3.8–10.8)

## 2021-12-26 LAB — COMPREHENSIVE METABOLIC PANEL
AG Ratio: 1.5 (calc) (ref 1.0–2.5)
ALT: 47 U/L — ABNORMAL HIGH (ref 9–46)
AST: 27 U/L (ref 10–40)
Albumin: 4.3 g/dL (ref 3.6–5.1)
Alkaline phosphatase (APISO): 75 U/L (ref 36–130)
BUN: 12 mg/dL (ref 7–25)
CO2: 24 mmol/L (ref 20–32)
Calcium: 9.1 mg/dL (ref 8.6–10.3)
Chloride: 106 mmol/L (ref 98–110)
Creat: 1 mg/dL (ref 0.60–1.26)
Globulin: 2.8 g/dL (calc) (ref 1.9–3.7)
Glucose, Bld: 62 mg/dL — ABNORMAL LOW (ref 65–139)
Potassium: 4.1 mmol/L (ref 3.5–5.3)
Sodium: 139 mmol/L (ref 135–146)
Total Bilirubin: 0.5 mg/dL (ref 0.2–1.2)
Total Protein: 7.1 g/dL (ref 6.1–8.1)

## 2021-12-29 NOTE — Progress Notes (Signed)
12/29/21-my chart message read by patient.

## 2022-03-25 ENCOUNTER — Telehealth: Payer: No Typology Code available for payment source | Admitting: Physician Assistant

## 2022-03-25 DIAGNOSIS — J069 Acute upper respiratory infection, unspecified: Secondary | ICD-10-CM | POA: Diagnosis not present

## 2022-03-25 MED ORDER — PSEUDOEPH-BROMPHEN-DM 30-2-10 MG/5ML PO SYRP: 5.0000 mL | ORAL_SOLUTION | Freq: Four times a day (QID) | ORAL | 0 refills | Status: DC | PRN

## 2022-03-25 MED ORDER — IPRATROPIUM BROMIDE 0.03 % NA SOLN: 2.0000 | Freq: Two times a day (BID) | NASAL | 0 refills | Status: DC

## 2022-03-25 MED ORDER — BENZONATATE 100 MG PO CAPS: 100.0000 mg | ORAL_CAPSULE | Freq: Three times a day (TID) | ORAL | 0 refills | Status: DC | PRN

## 2022-03-25 NOTE — Progress Notes (Signed)
E-Visit for Upper Respiratory Infection   We are sorry you are not feeling well.  Here is how we plan to help!  Based on what you have shared with me, it looks like you may have a viral upper respiratory infection.  Upper respiratory infections are caused by a large number of viruses; however, rhinovirus is the most common cause.   Symptoms vary from person to person, with common symptoms including sore throat, cough, fatigue or lack of energy and feeling of general discomfort.  A low-grade fever of up to 100.4 may present, but is often uncommon.  Symptoms vary however, and are closely related to a person's age or underlying illnesses.  The most common symptoms associated with an upper respiratory infection are nasal discharge or congestion, cough, sneezing, headache and pressure in the ears and face.  These symptoms usually persist for about 3 to 10 days, but can last up to 2 weeks.  It is important to know that upper respiratory infections do not cause serious illness or complications in most cases.    Upper respiratory infections can be transmitted from person to person, with the most common method of transmission being a person's hands.  The virus is able to live on the skin and can infect other persons for up to 2 hours after direct contact.  Also, these can be transmitted when someone coughs or sneezes; thus, it is important to cover the mouth to reduce this risk.  To keep the spread of the illness at bay, good hand hygiene is very important.  This is an infection that is most likely caused by a virus. There are no specific treatments other than to help you with the symptoms until the infection runs its course.  We are sorry you are not feeling well.  Here is how we plan to help!   For nasal congestion, you may use an oral decongestants such as Mucinex D or if you have glaucoma or high blood pressure use plain Mucinex.  Saline nasal spray or nasal drops can help and can safely be used as often as  needed for congestion.  For your congestion, I have prescribed Ipratropium Bromide nasal spray 0.03% two sprays in each nostril 2-3 times a day  If you do not have a history of heart disease, hypertension, diabetes or thyroid disease, prostate/bladder issues or glaucoma, you may also use Sudafed to treat nasal congestion.  It is highly recommended that you consult with a pharmacist or your primary care physician to ensure this medication is safe for you to take.     If you have a cough, you may use cough suppressants such as Delsym and Robitussin.  If you have glaucoma or high blood pressure, you can also use Coricidin HBP.   For cough I have prescribed for you A prescription cough medication called Tessalon Perles 100 mg. You may take 1-2 capsules every 8 hours as needed for cough and Bromfed DM Take 56mL every 6 hours as needed for cough. Can be used together with Tessalon perles.  If you have a sore or scratchy throat, use a saltwater gargle-  to  teaspoon of salt dissolved in a 4-ounce to 8-ounce glass of warm water.  Gargle the solution for approximately 15-30 seconds and then spit.  It is important not to swallow the solution.  You can also use throat lozenges/cough drops and Chloraseptic spray to help with throat pain or discomfort.  Warm or cold liquids can also be helpful in relieving  throat pain.  For headache, pain or general discomfort, you can use Ibuprofen or Tylenol as directed.   Some authorities believe that zinc sprays or the use of Echinacea may shorten the course of your symptoms.   HOME CARE Only take medications as instructed by your medical team. Be sure to drink plenty of fluids. Water is fine as well as fruit juices, sodas and electrolyte beverages. You may want to stay away from caffeine or alcohol. If you are nauseated, try taking small sips of liquids. How do you know if you are getting enough fluid? Your urine should be a pale yellow or almost colorless. Get  rest. Taking a steamy shower or using a humidifier may help nasal congestion and ease sore throat pain. You can place a towel over your head and breathe in the steam from hot water coming from a faucet. Using a saline nasal spray works much the same way. Cough drops, hard candies and sore throat lozenges may ease your cough. Avoid close contacts especially the very young and the elderly Cover your mouth if you cough or sneeze Always remember to wash your hands.   GET HELP RIGHT AWAY IF: You develop worsening fever. If your symptoms do not improve within 10 days You develop yellow or green discharge from your nose over 3 days. You have coughing fits You develop a severe head ache or visual changes. You develop shortness of breath, difficulty breathing or start having chest pain Your symptoms persist after you have completed your treatment plan  MAKE SURE YOU  Understand these instructions. Will watch your condition. Will get help right away if you are not doing well or get worse.  Thank you for choosing an e-visit.  Your e-visit answers were reviewed by a board certified advanced clinical practitioner to complete your personal care plan. Depending upon the condition, your plan could have included both over the counter or prescription medications.  Please review your pharmacy choice. Make sure the pharmacy is open so you can pick up prescription now. If there is a problem, you may contact your provider through [employer] and have the prescription routed to another pharmacy.  Your safety is important to [place]. If you have drug allergies check your prescription carefully.   For the next 24 hours you can use MyChart to ask questions about today's visit, request a non-urgent call back, or ask for a work or school excuse. You will get an email in the next two days asking about your experience. I hope that your e-visit has been valuable and will speed your recovery.   I provided 5 minutes  of non face-to-face time during this encounter for chart review and documentation.

## 2022-08-04 ENCOUNTER — Ambulatory Visit (INDEPENDENT_AMBULATORY_CARE_PROVIDER_SITE_OTHER): Payer: 59 | Admitting: Family Medicine

## 2022-08-04 VITALS — BP 137/89 | Temp 98.0°F | Ht 60.0 in | Wt 224.6 lb

## 2022-08-04 DIAGNOSIS — J020 Streptococcal pharyngitis: Secondary | ICD-10-CM | POA: Insufficient documentation

## 2022-08-04 LAB — POCT RAPID STREP A (OFFICE): Rapid Strep A Screen: POSITIVE — AB

## 2022-08-04 MED ORDER — AMOXICILLIN 500 MG PO CAPS: 500.0000 mg | ORAL_CAPSULE | Freq: Two times a day (BID) | ORAL | 0 refills | Status: AC

## 2022-08-04 NOTE — Assessment & Plan Note (Signed)
Rapid strep positive.  Treating with amoxicillin. 

## 2022-08-04 NOTE — Progress Notes (Signed)
Subjective:  Patient ID: Mike Harvey, male    DOB: March 01, 1986  Age: 37 y.o. MRN: [id number]  CC: Chief Complaint  Patient presents with   Fever    Headache, sinus symptoms, body aches, pain behind eyes and sore throat since yesterday    HPI:  37 year old male presents for evaluation of the above.   Patient reports that his symptoms started yesterday.  Began with sore throat.  Today his symptoms worsen.  He now has body aches and had an elevated temperature of 99.4.  He reports congestion as well but states that this is baseline.  He reports that he recently came to contact with a church member who had [place].  He has had a negative COVID test at home.  No relieving factors.  No other complaints.  Patient Active Problem List   Diagnosis Date Noted   Strep pharyngitis 08/04/2022   NASH (nonalcoholic steatohepatitis) 06/26/2021   Elevated LFTs 06/26/2021   Retention cyst of nasal sinus 02/04/2021   White coat hypertension 10/24/2014   Asthma 12/08/2012    Social Hx   Social History   Socioeconomic History   Marital status: Single    Spouse name: Not on file   Number of children: Not on file   Years of education: Not on file   Highest education level: Not on file  Occupational History   Not on file  Tobacco Use   Smoking status: Never   Smokeless tobacco: Never  Vaping Use   Vaping Use: Never used  Substance and Sexual Activity   Alcohol use: No    Alcohol/week: 0.0 standard drinks of alcohol   Drug use: No   Sexual activity: Never  Other Topics Concern   Not on file  Social History Narrative   Not on file   Social Determinants of Health   Financial Resource Strain: Not on file  Food Insecurity: Not on file  Transportation Needs: Not on file  Physical Activity: Not on file  Stress: Not on file  Social Connections: Not on file    Review of Systems Per HPI  Objective:  BP 137/89   Temp 98 F (36.7 C) (Oral)   Ht 5' (1.524 m)   Wt 224 lb 9.6 oz  (101.9 kg)   BMI 43.86 kg/m      08/04/2022    3:19 PM 12/25/2021    2:39 PM 06/26/2021    2:39 PM  BP/Weight  Systolic BP 297 989 211  Diastolic BP 89 92 941  Wt. (Lbs) 224.6 226.8   BMI 43.86 kg/m2 44.29 kg/m2     Physical Exam Vitals and nursing note reviewed.  Constitutional:      Appearance: Normal appearance.  HENT:     Head: Normocephalic and atraumatic.     Right Ear: Tympanic membrane normal.     Left Ear: Tympanic membrane normal.     Mouth/Throat:     Pharynx: Posterior oropharyngeal erythema present.  Eyes:     General:        Right eye: No discharge.        Left eye: No discharge.     Conjunctiva/sclera: Conjunctivae normal.  Cardiovascular:     Rate and Rhythm: Normal rate and regular rhythm.  Pulmonary:     Effort: Pulmonary effort is normal.     Breath sounds: Normal breath sounds. No wheezing or rales.  Neurological:     Mental Status: He is alert.  Psychiatric:  Mood and Affect: Mood normal.        Behavior: Behavior normal.     Lab Results  Component Value Date   WBC 7.6 12/25/2021   HGB 16.4 12/25/2021   HCT 47.7 12/25/2021   PLT 356 12/25/2021   GLUCOSE 62 (L) 12/25/2021   CHOL 167 12/29/2018   TRIG 79 12/29/2018   HDL 33 (L) 12/29/2018   LDLCALC 118 (H) 12/29/2018   ALT 47 (H) 12/25/2021   AST 27 12/25/2021   NA 139 12/25/2021   K 4.1 12/25/2021   CL 106 12/25/2021   CREATININE 1.00 12/25/2021   BUN 12 12/25/2021   CO2 24 12/25/2021   HGBA1C 5.6 03/10/2017     Assessment & Plan:   Problem List Items Addressed This Visit       Respiratory   Strep pharyngitis - Primary    Rapid strep positive.  Treating with amoxicillin.      Relevant Orders   POCT rapid strep A (Completed)    Meds ordered this encounter  Medications   amoxicillin (AMOXIL) 500 MG capsule    Sig: Take 1 capsule (500 mg total) by mouth 2 (two) times daily for 10 days.    Dispense:  20 capsule    Refill:  0    Follow-up:  Return if symptoms  worsen or fail to improve.  [place]

## 2022-10-13 ENCOUNTER — Encounter (INDEPENDENT_AMBULATORY_CARE_PROVIDER_SITE_OTHER): Payer: Self-pay | Admitting: Gastroenterology

## 2022-10-22 ENCOUNTER — Encounter (INDEPENDENT_AMBULATORY_CARE_PROVIDER_SITE_OTHER): Payer: Self-pay | Admitting: Gastroenterology

## 2022-12-24 ENCOUNTER — Ambulatory Visit (INDEPENDENT_AMBULATORY_CARE_PROVIDER_SITE_OTHER): Payer: No Typology Code available for payment source | Admitting: Gastroenterology

## 2022-12-28 ENCOUNTER — Encounter (INDEPENDENT_AMBULATORY_CARE_PROVIDER_SITE_OTHER): Payer: Self-pay | Admitting: Gastroenterology

## 2022-12-28 ENCOUNTER — Ambulatory Visit (INDEPENDENT_AMBULATORY_CARE_PROVIDER_SITE_OTHER): Payer: 59 | Admitting: Gastroenterology

## 2022-12-28 VITALS — BP 129/86 | HR 63 | Temp 99.3°F | Ht 67.0 in | Wt 209.9 lb

## 2022-12-28 DIAGNOSIS — K7581 Nonalcoholic steatohepatitis (NASH): Secondary | ICD-10-CM | POA: Diagnosis not present

## 2022-12-28 DIAGNOSIS — R7989 Other specified abnormal findings of blood chemistry: Secondary | ICD-10-CM | POA: Diagnosis not present

## 2022-12-28 NOTE — Progress Notes (Signed)
Referring Provider: Babs Sciara, MD Primary Care Physician:  Babs Sciara, MD Primary GI Physician: Levon Hedger   Chief Complaint  Patient presents with   nash    Follow up on NASH. Reports doing well and has been exercising and watching portions to lose weight.    HPI:   Mike Harvey is a 37 y.o. male with past medical history of asthma and NASH   Patient presenting today for follow up of NASH  [place] RUQ in July 2022 with hepatic steatosis.  Last seen June 2023, at that time, doing well, trying to lose weight. Trying to exercise.   Recommended to exercise and implement mediterranean diet, CMP and CBC checked, CMP with mildly elevated ALT of 47   Present:  Notably down 13 pounds since January 2024  He is trying to exercise more and change his diet with smaller portion size. States he is participating in a weigh loss challenge at work and trying hard to manage his weight. He has no GI complaints today.   No red flag symptoms. Patient denies melena, hematochezia, nausea, vomiting, diarrhea, constipation, dysphagia, odyonophagia, early satiety or weight loss.   Last Colonoscopy: never  Last Endoscopy: never  Recommendations:    Past Medical History:  Diagnosis Date   Asthma    White coat hypertension     No past surgical history on file.  Current Outpatient Medications  Medication Sig Dispense Refill   albuterol (VENTOLIN HFA) 108 (90 Base) MCG/ACT inhaler Inhale into the lungs every 6 (six) hours as needed for wheezing or shortness of breath.     cetirizine (ZYRTEC) 10 MG tablet Take 10 mg by mouth daily.     No current facility-administered medications for this visit.    Allergies as of 12/28/2022   (No Known Allergies)    Family History  Problem Relation Age of Onset   Kidney disease Mother    Cancer Maternal Grandfather        lung cancer; smoker    Social History   Socioeconomic History   Marital status: Single    Spouse name: Not on file    Number of children: Not on file   Years of education: Not on file   Highest education level: Not on file  Occupational History   Not on file  Tobacco Use   Smoking status: Never    Passive exposure: Never   Smokeless tobacco: Never  Vaping Use   Vaping Use: Never used  Substance and Sexual Activity   Alcohol use: No    Alcohol/week: 0.0 standard drinks of alcohol   Drug use: No   Sexual activity: Never  Other Topics Concern   Not on file  Social History Narrative   Not on file   Social Determinants of Health   Financial Resource Strain: Not on file  Food Insecurity: Not on file  Transportation Needs: Not on file  Physical Activity: Not on file  Stress: Not on file  Social Connections: Not on file    Review of systems General: negative for malaise, night sweats, fever, chills, weight loss Neck: Negative for lumps, goiter, pain and significant neck swelling Resp: Negative for cough, wheezing, dyspnea at rest CV: Negative for chest pain, leg swelling, palpitations, orthopnea GI: denies melena, hematochezia, nausea, vomiting, diarrhea, constipation, dysphagia, odyonophagia, early satiety or unintentional weight loss.  MSK: Negative for joint pain or swelling, back pain, and muscle pain. Derm: Negative for itching or rash Psych: Denies depression, anxiety, memory loss,  confusion. No homicidal or suicidal ideation.  Heme: Negative for prolonged bleeding, bruising easily, and swollen nodes. Endocrine: Negative for cold or heat intolerance, polyuria, polydipsia and goiter. Neuro: negative for tremor, gait imbalance, syncope and seizures. The remainder of the review of systems is noncontributory.  Physical Exam: BP 129/86   Pulse 63   Temp 99.3 F (37.4 C) (Oral)   Ht 5\' 7"  (1.702 m)   Wt 209 lb 14.4 oz (95.2 kg)   BMI 32.87 kg/m  General:   Alert and oriented. No distress noted. Pleasant and cooperative.  Head:  Normocephalic and atraumatic. Eyes:  Conjuctiva clear  without scleral icterus. Mouth:  Oral mucosa pink and moist. Good dentition. No lesions. Heart: Normal rate and rhythm, s1 and s2 heart sounds present.  Lungs: Clear lung sounds in all lobes. Respirations equal and unlabored. Abdomen:  +BS, soft, non-tender and non-distended. No rebound or guarding. No HSM or masses noted. Derm: No palmar erythema or jaundice Msk:  Symmetrical without gross deformities. Normal posture. Extremities:  Without edema. Neurologic:  Alert and  oriented x4 Psych:  Alert and cooperative. Normal mood and affect.  Invalid input(s): "6 MONTHS"   ASSESSMENT: Mike Harvey is a 37 y.o. male presenting today for follow up of NASH  [place] of liver in 2022 with hepatic steatosis, mildly elevated aminotransferases with last ALT in 2023 47. Patient down 13 pounds since January which I congratulated him on. Encouraged to continue with dietary changes/mediterranean diet and exercise. FIB4 score with labs from 2023 was 0.40- indicating advanced fibrosis likely excluded.  Will update CBC and CMP.   PLAN:  Continue with exercise  2. Repeat CMP/CBC  3.  Mediterranean diet/dietary modifications 4. Continue to avoid alcohol  All questions were answered, patient verbalized understanding and is in agreement with plan as outlined above.   Follow Up: 1 year   Kiersten Coss L. Jeanmarie Hubert, MSN, APRN, AGNP-C Adult-Gerontology Nurse Practitioner [facility]  I have reviewed the note and agree with the APP's assessment as described in this progress note  [name], MD Gastroenterology and Hepatology [facility]

## 2022-12-28 NOTE — Patient Instructions (Addendum)
Congratulations on your weight loss!  I am providing the mediterranean diet, this is a guideline to help you know which foods you should eat and those you should try to limit or avoid. It is important to make sure you are getting atleast 30 minutes of exercise 4-5x/week You should aim for 5-7% decrease in overall weight. Fatty liver/NASH is usually well managed with dietary and lifestyle modifications, however, in rare cases, this can progress to fibrosis/cirrhosis of the liver, which is serious. Please avoid alcohol as this can worsen liver issues.   I will update some labs and be in touch once I have these   Follow up 1 year

## 2022-12-29 LAB — COMPREHENSIVE METABOLIC PANEL
ALT: 59 IU/L — ABNORMAL HIGH (ref 0–44)
AST: 42 IU/L — ABNORMAL HIGH (ref 0–40)
Albumin: 4.6 g/dL (ref 4.1–5.1)
Alkaline Phosphatase: 99 IU/L (ref 44–121)
BUN/Creatinine Ratio: 10 (ref 9–20)
BUN: 11 mg/dL (ref 6–20)
Bilirubin Total: 0.8 mg/dL (ref 0.0–1.2)
CO2: 24 mmol/L (ref 20–29)
Calcium: 9.7 mg/dL (ref 8.7–10.2)
Chloride: 104 mmol/L (ref 96–106)
Creatinine, Ser: 1.13 mg/dL (ref 0.76–1.27)
Globulin, Total: 2.8 g/dL (ref 1.5–4.5)
Glucose: 80 mg/dL (ref 70–99)
Potassium: 4.2 mmol/L (ref 3.5–5.2)
Sodium: 141 mmol/L (ref 134–144)
Total Protein: 7.4 g/dL (ref 6.0–8.5)
eGFR: 86 mL/min/{1.73_m2} (ref >59)

## 2022-12-29 LAB — CBC
Hematocrit: 48 % (ref 37.5–51.0)
Hemoglobin: 16.1 g/dL (ref 13.0–17.7)
MCH: 31.9 pg (ref 26.6–33.0)
MCHC: 33.5 g/dL (ref 31.5–35.7)
MCV: 95 fL (ref 79–97)
Platelets: 336 10*3/uL (ref 150–450)
RBC: 5.05 x10E6/uL (ref 4.14–5.80)
RDW: 13 % (ref 11.6–15.4)
WBC: 7.3 10*3/uL (ref 3.4–10.8)

## 2023-02-04 ENCOUNTER — Ambulatory Visit (INDEPENDENT_AMBULATORY_CARE_PROVIDER_SITE_OTHER): Payer: 59 | Admitting: Gastroenterology

## 2023-04-07 ENCOUNTER — Other Ambulatory Visit (INDEPENDENT_AMBULATORY_CARE_PROVIDER_SITE_OTHER): Payer: Self-pay | Admitting: *Deleted

## 2023-04-07 ENCOUNTER — Telehealth (INDEPENDENT_AMBULATORY_CARE_PROVIDER_SITE_OTHER): Payer: Self-pay | Admitting: *Deleted

## 2023-04-07 ENCOUNTER — Encounter (INDEPENDENT_AMBULATORY_CARE_PROVIDER_SITE_OTHER): Payer: Self-pay | Admitting: *Deleted

## 2023-04-07 DIAGNOSIS — K7581 Nonalcoholic steatohepatitis (NASH): Secondary | ICD-10-CM

## 2023-04-07 DIAGNOSIS — R7989 Other specified abnormal findings of blood chemistry: Secondary | ICD-10-CM

## 2023-04-07 NOTE — Telephone Encounter (Signed)
Patient in reminder file for repeat LFT. Sent patient a Clinical cytogeneticist message and put in lab order.

## 2023-04-08 NOTE — Telephone Encounter (Signed)
Pt has not read mychart message so I called and let him know he needs to go to [facility] for LFT. He verbalized understanding.

## 2023-04-10 LAB — HEPATIC FUNCTION PANEL
ALT: 46 IU/L — ABNORMAL HIGH (ref 0–44)
AST: 29 IU/L (ref 0–40)
Albumin: 4.6 g/dL (ref 4.1–5.1)
Alkaline Phosphatase: 91 IU/L (ref 44–121)
Bilirubin Total: 0.5 mg/dL (ref 0.0–1.2)
Bilirubin, Direct: 0.11 mg/dL (ref 0.00–0.40)
Total Protein: 7.5 g/dL (ref 6.0–8.5)

## 2023-04-29 NOTE — Telephone Encounter (Signed)
error 

## 2023-06-09 ENCOUNTER — Telehealth: Payer: Self-pay | Admitting: Family Medicine

## 2023-06-09 ENCOUNTER — Encounter: Payer: Self-pay | Admitting: Family Medicine

## 2023-06-09 NOTE — Telephone Encounter (Signed)
Diagnosis wellness CBC, CMP, lipid

## 2023-06-09 NOTE — Telephone Encounter (Signed)
Patient needing labs for physical on 12/26.

## 2023-06-11 ENCOUNTER — Other Ambulatory Visit: Payer: Self-pay

## 2023-06-11 DIAGNOSIS — Z0000 Encounter for general adult medical examination without abnormal findings: Secondary | ICD-10-CM

## 2023-06-11 NOTE — Telephone Encounter (Signed)
Called pt to inform him labs have been ordered and to have them done before next visit

## 2023-06-29 LAB — COMPREHENSIVE METABOLIC PANEL
ALT: 48 [IU]/L — ABNORMAL HIGH (ref 0–44)
AST: 28 [IU]/L (ref 0–40)
Albumin: 4.6 g/dL (ref 4.1–5.1)
Alkaline Phosphatase: 95 [IU]/L (ref 44–121)
BUN/Creatinine Ratio: 10 (ref 9–20)
BUN: 12 mg/dL (ref 6–20)
Bilirubin Total: 0.6 mg/dL (ref 0.0–1.2)
CO2: 24 mmol/L (ref 20–29)
Calcium: 9.4 mg/dL (ref 8.7–10.2)
Chloride: 104 mmol/L (ref 96–106)
Creatinine, Ser: 1.18 mg/dL (ref 0.76–1.27)
Globulin, Total: 2.9 g/dL (ref 1.5–4.5)
Glucose: 92 mg/dL (ref 70–99)
Potassium: 4.3 mmol/L (ref 3.5–5.2)
Sodium: 142 mmol/L (ref 134–144)
Total Protein: 7.5 g/dL (ref 6.0–8.5)
eGFR: 82 mL/min/{1.73_m2} (ref >59)

## 2023-06-29 LAB — CBC WITH DIFFERENTIAL/PLATELET
Basophils Absolute: 0 10*3/uL (ref 0.0–0.2)
Basos: 0 %
EOS (ABSOLUTE): 0 10*3/uL (ref 0.0–0.4)
Eos: 1 %
Hematocrit: 50.6 % (ref 37.5–51.0)
Hemoglobin: 17.2 g/dL (ref 13.0–17.7)
Immature Grans (Abs): 0 10*3/uL (ref 0.0–0.1)
Immature Granulocytes: 0 %
Lymphocytes Absolute: 2 10*3/uL (ref 0.7–3.1)
Lymphs: 31 %
MCH: 33 pg (ref 26.6–33.0)
MCHC: 34 g/dL (ref 31.5–35.7)
MCV: 97 fL (ref 79–97)
Monocytes Absolute: 0.7 10*3/uL (ref 0.1–0.9)
Monocytes: 11 %
Neutrophils Absolute: 3.8 10*3/uL (ref 1.4–7.0)
Neutrophils: 57 %
Platelets: 325 10*3/uL (ref 150–450)
RBC: 5.22 x10E6/uL (ref 4.14–5.80)
RDW: 12.7 % (ref 11.6–15.4)
WBC: 6.6 10*3/uL (ref 3.4–10.8)

## 2023-06-29 LAB — LIPID PANEL
Chol/HDL Ratio: 5.4 {ratio} — ABNORMAL HIGH (ref 0.0–5.0)
Cholesterol, Total: 200 mg/dL — ABNORMAL HIGH (ref 100–199)
HDL: 37 mg/dL — ABNORMAL LOW (ref >39)
LDL Chol Calc (NIH): 133 mg/dL — ABNORMAL HIGH (ref 0–99)
Triglycerides: 166 mg/dL — ABNORMAL HIGH (ref 0–149)
VLDL Cholesterol Cal: 30 mg/dL (ref 5–40)

## 2023-07-01 ENCOUNTER — Ambulatory Visit: Payer: 59 | Admitting: Family Medicine

## 2023-07-01 VITALS — BP 134/94 | HR 63 | Temp 98.8°F | Ht 68.11 in | Wt 216.4 lb

## 2023-07-01 DIAGNOSIS — Z566 Other physical and mental strain related to work: Secondary | ICD-10-CM

## 2023-07-01 DIAGNOSIS — R030 Elevated blood-pressure reading, without diagnosis of hypertension: Secondary | ICD-10-CM

## 2023-07-01 DIAGNOSIS — R0789 Other chest pain: Secondary | ICD-10-CM | POA: Diagnosis not present

## 2023-07-01 DIAGNOSIS — Z0001 Encounter for general adult medical examination with abnormal findings: Secondary | ICD-10-CM | POA: Diagnosis not present

## 2023-07-01 DIAGNOSIS — E785 Hyperlipidemia, unspecified: Secondary | ICD-10-CM

## 2023-07-01 DIAGNOSIS — K760 Fatty (change of) liver, not elsewhere classified: Secondary | ICD-10-CM

## 2023-07-01 DIAGNOSIS — Z0000 Encounter for general adult medical examination without abnormal findings: Secondary | ICD-10-CM

## 2023-07-01 NOTE — Progress Notes (Signed)
Subjective:    Patient ID: Mike Harvey, male    DOB: 1986-04-15, 37 y.o.   MRN: [id number]  HPI The patient comes in today for a wellness visit.  Patient under fair amount of stress at work At times he feels overwhelmed He denies being depressed or anxious He states when he does get extremely busy he sometimes feels a tightness in the left side of the chest radiates up toward the neck  When he goes walking he denies chest pressure tightness or shortness of breath.  He tries to walk 2 or 3 times per week He is trying to eat healthier and try to keep his weight down he is 10 pounds less than what he used to be a couple years ago      Discussed the use of AI scribe software for clinical note transcription with the patient, who gave verbal consent to proceed.  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 This has led to an increased workload and the need to work on weekends, which has been a significant source of stress.  The patient reports experiencing pain on the left side of his chest, which he describes as spasms of discomfort that last for a few minutes at a time. The pain is not always in the same place and does not cause any problems with breathing or noticeable increase in heart rate. However, the onset of the pain often leads to a moment of panic, during which the patient becomes hot and starts sweating.  Despite the stress and chest discomfort, the patient has been able to maintain a regular sleep schedule and has been able to disconnect from work in the evenings. He has also been able to maintain some level of physical activity, including walking on weekends.  The patient has noticed feeling a little winded at  times, which he attributes to not walking as much as he used to. He has a goal to increase his physical activity in the coming months.  The patient's overall health is perceived as good, with no issues reported with bowel movements or urination. He has been trying to maintain a healthy diet, but acknowledges that it can be challenging due to his work schedule and the convenience of fast food.  The patient's stress levels outside of work are reported as manageable, with no significant issues reported in his personal life. He has been trying to compartmentalize his work stress and not let it affect his personal life.  In summary, the patient is dealing with significant work-related stress that has been associated with episodes of chest discomfort. He is managing his stress as best as he can and is making efforts to maintain a healthy lifestyle.       A review of their health history was completed.  A review of medications was also completed.  Any needed refills; Inhaler  Eating habits: Could be better  Falls/  MVA accidents in past few months: No  Regular exercise: Tries to walk when he can  Specialist pt sees on regular basis: Gastro  Preventative health issues were discussed.   Additional concerns: Pain in chest that wraps over shoulder to back pt states he thinks this could be due to stress, has been going on  for 2 weeks  He denies any substernal chest pressure tightness Review of Systems     Objective:   Physical Exam  General-in no acute distress Eyes-no discharge Lungs-respiratory rate normal, CTA CV-no murmurs,RRR Extremities skin warm dry no edema Neuro grossly normal Behavior normal, alert  EKG looks good Lab work reviewed in detail LDL mildly elevated HDL is low Based on current guidelines does not need statins at this moment but patient was told as he gets older if these numbers worsen he would need to be on cholesterol medicine     Assessment & Plan:  1.  Adult wellness visit (Primary) Adult wellness-complete.wellness physical was conducted today. Importance of diet and exercise were discussed in detail.  Importance of stress reduction and healthy living were discussed.  In addition to this a discussion regarding safety was also covered.  We also reviewed over immunizations and gave recommendations regarding current immunization needed for age.   In addition to this additional areas were also touched on including: Preventative health exams needed:  Colonoscopy not indicated  Patient was advised yearly wellness exam   2. Chest tightness I feel that this is stress related EKG itself does not show any acute ST segment changes symptomatology points more towards stress does not point toward angina - EKG 12-Lead  3. Fatty liver 1 liver enzyme mildly elevated very important for him to continue to try to keep weight down healthy eating regular exercise previous ultrasound showed fatty liver  4. Hyperlipidemia, unspecified hyperlipidemia type Currently would not meet protocol for statins but should this worsen as he gets older statins would be indicated  5. Elevated blood pressure reading Healthy eating regular physical activity fitting and walking several times per week and had a healthy pace will help bring this down recheck blood pressure in 3 months if not doing better at that time initiate medication  6. Stress at work Patient is doing a good job at compartmentalizing trying to keep things under reasonable control not depressed tries to strike a healthy worklife balance

## 2023-07-05 ENCOUNTER — Encounter: Payer: Self-pay | Admitting: Family Medicine

## 2023-07-05 ENCOUNTER — Other Ambulatory Visit: Payer: Self-pay

## 2023-07-05 MED ORDER — ALBUTEROL SULFATE HFA 108 (90 BASE) MCG/ACT IN AERS: 2.0000 | INHALATION_SPRAY | Freq: Four times a day (QID) | RESPIRATORY_TRACT | 4 refills | Status: DC | PRN

## 2023-10-06 ENCOUNTER — Ambulatory Visit: Payer: 59 | Admitting: Family Medicine

## 2023-10-06 VITALS — BP 132/80 | HR 60 | Temp 98.1°F | Ht 68.11 in | Wt 221.0 lb

## 2023-10-06 DIAGNOSIS — K760 Fatty (change of) liver, not elsewhere classified: Secondary | ICD-10-CM | POA: Diagnosis not present

## 2023-10-06 DIAGNOSIS — E785 Hyperlipidemia, unspecified: Secondary | ICD-10-CM

## 2023-10-06 DIAGNOSIS — R7989 Other specified abnormal findings of blood chemistry: Secondary | ICD-10-CM

## 2023-10-06 MED ORDER — ALBUTEROL SULFATE HFA 108 (90 BASE) MCG/ACT IN AERS: 2.0000 | INHALATION_SPRAY | Freq: Four times a day (QID) | RESPIRATORY_TRACT | 4 refills | Status: AC | PRN

## 2023-10-06 NOTE — Progress Notes (Signed)
 Subjective:    Patient ID: Mike Harvey, male    DOB: 06/28/1986, 38 y.o.   MRN: [id number]  Discussed the use of AI scribe software for clinical note transcription with the patient, who gave verbal consent to proceed.  History of Present Illness   Mike Harvey is a 38 year old male who presents with stress and tiredness related to work demands.  He experiences significant stress and tiredness due to increased work demands. He describes himself as naturally high-strung, which contributes to his stress levels. Despite feeling stressed and tired, he does not mention any other specific symptoms.  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 He has been working in his current job since he was 38 years old and has a 35-40 minute commute each way. Despite the stress, he feels a sense of responsibility towards his team and is hesitant to leave them in a difficult position.  His dietary habits have been affected, often resorting to fast food due to lack of time and energy to cook after work. He tries to make healthier choices when possible, such as opting for salads or chicken sandwiches during lunch. However, dinner often consists of less healthy options like hamburgers due to convenience.  He has not been able to engage in physical activity, such as walking, for the past two to three weeks due to long working hours. He acknowledges the importance of incorporating some form of physical activity into his routine for overall health benefits.  He has accumulated 284 hours of vacation time but finds it challenging to take time off due to work deadlines. He plans to take a week off in June for a family vacation and hopes to take additional time off later in the year.         Review of Systems     Objective:    Physical Exam   VITALS: BP- 134/80     General-in no acute distress Eyes-no discharge Lungs-respiratory rate normal, CTA CV-no  murmurs,RRR Extremities skin warm dry no edema Neuro grossly normal Behavior normal, alert       Assessment & Plan:  Assessment and Plan    Work-related stress Experiencing significant stress due to increased workload and long hours. Aware of need for work-life balance but finds it challenging. - Encouraged taking time off, including planned vacation in June. - Advised on stress management techniques: short breaks, deep breathing, mindfulness. - Discussed importance of work-life balance and communication with management about workload.  Dietary habits Often resorts to fast food due to work demands, though attempts healthier choices. - Encouraged healthier food choices when eating out, such as salads or lean protein sandwiches. - Suggested planning meals ahead to avoid fast food. - Discussed benefits of grocery store food bars for healthier options.  Physical inactivity Has not engaged in regular physical activity for two to three weeks due to work hours. - Encouraged incorporating short walks or physical activity into daily routine. - Discussed benefits of regular physical activity for stress management and overall health.  history elevated BP Blood pressure well-controlled at 134/80 mmHg. Faces challenges maintaining healthy lifestyle due to work. - Encouraged maintaining balanced diet and regular physical activity.      Patient will do labs for or elevated liver enzymes as well as fatty liver he is trying to maintain a healthy diet but very difficult with his work schedule we will get the lab results where share that with  him as well as his gastroenterologist

## 2023-12-28 ENCOUNTER — Ambulatory Visit (INDEPENDENT_AMBULATORY_CARE_PROVIDER_SITE_OTHER): Payer: 59 | Admitting: Gastroenterology

## 2023-12-28 ENCOUNTER — Encounter (INDEPENDENT_AMBULATORY_CARE_PROVIDER_SITE_OTHER): Payer: Self-pay | Admitting: Gastroenterology

## 2023-12-28 VITALS — BP 146/90 | HR 66 | Temp 98.0°F | Ht 68.0 in | Wt 216.8 lb

## 2023-12-28 DIAGNOSIS — K7581 Nonalcoholic steatohepatitis (NASH): Secondary | ICD-10-CM

## 2023-12-28 DIAGNOSIS — R7401 Elevation of levels of liver transaminase levels: Secondary | ICD-10-CM

## 2023-12-28 DIAGNOSIS — R7989 Other specified abnormal findings of blood chemistry: Secondary | ICD-10-CM

## 2023-12-28 NOTE — Progress Notes (Addendum)
 Referring Provider: Alphonsa Glendia LABOR, MD Primary Care Physician:  Alphonsa Glendia LABOR, MD Primary GI Physician: Dr. 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    HPI:   Mike Harvey is a 38 y.o. male with past medical history of Asthma, MASH   Patient presenting today for:  Follow up MASH, elevated LFTs   Last seen June 2024, at that time had lost some weight, trying to change diet and exercise.   Recommended continue dietary and lifestyle modifications, repeat CMP, CBC, mediterranean diet, continue to avoid alcohol  Last labs December 2024 with AST 28, ALT 48, alk phos 95, t bili 0.6, plt 325  Present: States he was supposed to have repeat labs in April with PCP but got sick and he did not do these. He is trying to watch what he eats when he can. Had difficulty with mediterranean diet due to his job. Notes it is easier to eat healthy when its not tax season and he is not as busy at work, otherwise does eat some fast food. During this time of year he tries to eat more baked stuff. He is trying to exercise when he is able to when work is not so busy. Does not drink alcohol. No swelling to abdomen or legs  No red flag symptoms. Patient denies melena, hematochezia, nausea, vomiting, diarrhea, constipation, dysphagia, odyonophagia, early satiety or weight loss.    US  RUQ in July 2022 with hepatic steatosis.  Last Colonoscopy: never  Last Endoscopy: never   [facility] Weights   12/28/23 1431  Weight: 216 lb 12.8 oz (98.3 kg)     Past Medical History:  Diagnosis Date   Asthma    White coat hypertension     History reviewed. No pertinent surgical history.  Current Outpatient Medications  Medication Sig Dispense Refill   albuterol  (VENTOLIN  HFA) 108 (90 Base) MCG/ACT inhaler Inhale 2 puffs into the lungs every 6 (six) hours as needed for wheezing or shortness of breath. 8 g 4   cetirizine (ZYRTEC) 10 MG  tablet Take 10 mg by mouth daily.     No current facility-administered medications for this visit.    Allergies as of 12/28/2023   (No Known Allergies)    Social History   Socioeconomic History   Marital status: Single    Spouse name: Not on file   Number of children: Not on file   Years of education: Not on file   Highest education level: Bachelor's degree (e.g., BA, AB, BS)  Occupational History   Not on file  Tobacco Use   Smoking status: Never    Passive exposure: Never   Smokeless tobacco: Never  Vaping Use   Vaping status: Never Used  Substance and Sexual Activity   Alcohol use: No    Alcohol/week: 0.0 standard drinks of alcohol   Drug use: No   Sexual activity: Never  Other Topics Concern   Not on file  Social History Narrative   Not on file   Social Drivers of Health   Financial Resource Strain: Patient Declined (10/06/2023)   Overall Financial Resource Strain (CARDIA)    Difficulty of Paying Living Expenses: Patient declined  Food Insecurity: Patient Declined (10/06/2023)   Hunger Vital Sign    Worried About Running Out of Food in the Last Year: Patient declined    Ran Out of Food in the Last Year: Patient  declined  Transportation Needs: Patient Declined (10/06/2023)   PRAPARE - Administrator, Civil Service (Medical): Patient declined    Lack of Transportation (Non-Medical): Patient declined  Physical Activity: Insufficiently Active (10/06/2023)   Exercise Vital Sign    Days of Exercise per Week: 1 day    Minutes of Exercise per Session: 20 min  Stress: Stress Concern Present (10/06/2023)   [employer] of Occupational Health - Occupational Stress Questionnaire    Feeling of Stress : To some extent  Social Connections: Moderately Isolated (10/06/2023)   Social Connection and Isolation Panel    Frequency of Communication with Friends and Family: More than three times a week    Frequency of Social Gatherings with Friends and Family: More than  three times a week    Attends Religious Services: More than 4 times per year    Active Member of [employer] or Organizations: No    Attends Engineer, structural: Not on file    Marital Status: Never married    Review of systems General: negative for malaise, night sweats, fever, chills, weight loss Neck: Negative for lumps, goiter, pain and significant neck swelling Resp: Negative for cough, wheezing, dyspnea at rest CV: Negative for chest pain, leg swelling, palpitations, orthopnea GI: denies melena, hematochezia, nausea, vomiting, diarrhea, constipation, dysphagia, odyonophagia, early satiety or unintentional weight loss.  MSK: Negative for joint pain or swelling, back pain, and muscle pain. Derm: Negative for itching or rash Psych: Denies depression, anxiety, memory loss, confusion. No homicidal or suicidal ideation.  Heme: Negative for prolonged bleeding, bruising easily, and swollen nodes. Endocrine: Negative for cold or heat intolerance, polyuria, polydipsia and goiter. Neuro: negative for tremor, gait imbalance, syncope and seizures. The remainder of the review of systems is noncontributory.  Physical Exam: BP (!) 146/90 (BP Location: Left Arm, Patient Position: Sitting, Cuff Size: Normal)   Pulse 66   Temp 98 F (36.7 C) (Temporal)   Ht 5' 8 (1.727 m)   Wt 216 lb 12.8 oz (98.3 kg)   BMI 32.96 kg/m  General:   Alert and oriented. No distress noted. Pleasant and cooperative.  Head:  Normocephalic and atraumatic. Eyes:  Conjuctiva clear without scleral icterus. Mouth:  Oral mucosa pink and moist. Good dentition. No lesions. Heart: Normal rate and rhythm, s1 and s2 heart sounds present.  Lungs: Clear lung sounds in all lobes. Respirations equal and unlabored. Abdomen:  +BS, soft, non-tender and non-distended. No rebound or guarding. No HSM or masses noted. Derm: No palmar erythema or jaundice Msk:  Symmetrical without gross deformities. Normal posture. Extremities:   Without edema. Neurologic:  Alert and  oriented x4 Psych:  Alert and cooperative. Normal mood and affect.  Invalid input(s): 6 MONTHS   ASSESSMENT: Mike Harvey is a 38 y.o. male presenting today for follow up of MASH, elevated LFTs   History of MASH with ongoing mild elevation of transaminases, and previous negative serologies for AIH, viral etiologies or elevated ferritin. He is trying to watch what he eats and exercise as much as he is able to. Last US  of the liver in 2022 with hepatic steatosis. 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 We discussed importance of continued good weight management, routine physical activity and avoidance of ETOH. I briefly discussed rezdiffra with the patient though at this time, though I have a low suspicion  he will have F2-F3 fibrosis on the above investigations.    PLAN:  -mediterranean diet -physical activity, aim for atleast 30 minutes per day 4-5 days per week -US  elastography  -Fibrosure (fast 12 hours before)  -CBC  (pt has HFP ordered with PCP already, will follow for results) -continue to avoid ETOH  All questions were answered, patient verbalized understanding and is in agreement with plan as outlined above.   Follow Up: 1 year   Char Feltman L. Mariette, MSN, APRN, AGNP-C Adult-Gerontology Nurse Practitioner [facility]  I have reviewed the note and agree with the APP's assessment as described in this progress note  Toribio Fortune, MD Gastroenterology and Hepatology [facility]

## 2023-12-28 NOTE — Patient Instructions (Signed)
 I have ordered an US  of your liver and some labs as well, please do these labs when you complete labs for Dr. Alphonsa, please fast ( do not eat or drink anything other than water, 12 hours prior to labs) Continue to try and follow mediterranean diet  It is important to make sure you are getting atleast 30 minutes of exercise 4-5x/week You should aim for 5-7% decrease in overall weight. Fatty liver is usually well managed with dietary and lifestyle modifications, however, in rare cases, this can progress to fibrosis/cirrhosis of the liver, which is serious.  Please avoid alcohol as this can worsen liver issues.   Follow up 1 year  It was a pleasure to see you today. I want to create trusting relationships with patients and provide genuine, compassionate, and quality care. I truly value your feedback! please be on the lookout for a survey regarding your visit with me today. I appreciate your input about our visit and your time in completing this!    Clarice Zulauf L. Salina Stanfield, MSN, APRN, AGNP-C Adult-Gerontology Nurse Practitioner [facility]

## 2023-12-31 LAB — LIPID PANEL
Chol/HDL Ratio: 5.8 ratio — ABNORMAL HIGH (ref 0.0–5.0)
Cholesterol, Total: 186 mg/dL (ref 100–199)
HDL: 32 mg/dL — ABNORMAL LOW (ref >39)
LDL Chol Calc (NIH): 124 mg/dL — ABNORMAL HIGH (ref 0–99)
Triglycerides: 167 mg/dL — ABNORMAL HIGH (ref 0–149)
VLDL Cholesterol Cal: 30 mg/dL (ref 5–40)

## 2023-12-31 LAB — HEPATIC FUNCTION PANEL
ALT: 56 IU/L — ABNORMAL HIGH (ref 0–44)
AST: 32 IU/L (ref 0–40)
Albumin: 4.6 g/dL (ref 4.1–5.1)
Alkaline Phosphatase: 106 IU/L (ref 44–121)
Bilirubin Total: 0.6 mg/dL (ref 0.0–1.2)
Bilirubin, Direct: 0.17 mg/dL (ref 0.00–0.40)
Total Protein: 7.2 g/dL (ref 6.0–8.5)

## 2023-12-31 LAB — C-REACTIVE PROTEIN: CRP: 1 mg/L (ref 0–10)

## 2023-12-31 LAB — ANTI-SMOOTH MUSCLE ANTIBODY, IGG: Smooth Muscle Ab: 10 U (ref 0–19)

## 2023-12-31 LAB — NASH FIBROSURE(R) PLUS

## 2024-01-01 ENCOUNTER — Ambulatory Visit: Payer: Self-pay | Admitting: Family Medicine

## 2024-01-01 LAB — NASH FIBROSURE(R) PLUS
ALPHA 2-MACROGLOBULINS, QN: 149 mg/dL (ref 110–276)
ALT (SGPT) P5P: 64 IU/L — AB (ref 0–55)
AST (SGOT) P5P: 33 IU/L (ref 0–40)
Apolipoprotein A-1: 114 mg/dL (ref 101–178)
Bilirubin, Total: 0.5 mg/dL (ref 0.0–1.2)
Cholesterol, Total: 194 mg/dL (ref 100–199)
Fibrosis Score: 0.17 (ref 0.00–0.21)
GGT: 44 IU/L (ref 0–65)
Glucose: 101 mg/dL — AB (ref 70–99)
Haptoglobin: 127 mg/dL (ref 17–317)
NASH Score: 0.47 — AB (ref 0.00–0.25)
Steatosis Score: 0.64 — AB (ref 0.00–0.40)
Triglycerides: 171 mg/dL — AB (ref 0–149)

## 2024-01-01 LAB — CBC
Hematocrit: 47.9 % (ref 37.5–51.0)
Hemoglobin: 16.4 g/dL (ref 13.0–17.7)
MCH: 33.1 pg — ABNORMAL HIGH (ref 26.6–33.0)
MCHC: 34.2 g/dL (ref 31.5–35.7)
MCV: 97 fL (ref 79–97)
Platelets: 356 10*3/uL (ref 150–450)
RBC: 4.96 x10E6/uL (ref 4.14–5.80)
RDW: 13.3 % (ref 11.6–15.4)
WBC: 6.6 10*3/uL (ref 3.4–10.8)

## 2024-01-03 ENCOUNTER — Ambulatory Visit (INDEPENDENT_AMBULATORY_CARE_PROVIDER_SITE_OTHER): Payer: Self-pay | Admitting: Gastroenterology

## 2024-01-04 ENCOUNTER — Ambulatory Visit (HOSPITAL_COMMUNITY)
Admission: RE | Admit: 2024-01-04 | Discharge: 2024-01-04 | Disposition: A | Discharge status: Home / Self Care | Source: Ambulatory Visit | Attending: Gastroenterology | Admitting: Gastroenterology

## 2024-01-04 DIAGNOSIS — R7989 Other specified abnormal findings of blood chemistry: Secondary | ICD-10-CM | POA: Diagnosis present

## 2024-01-04 DIAGNOSIS — K7581 Nonalcoholic steatohepatitis (NASH): Secondary | ICD-10-CM | POA: Insufficient documentation

## 2024-04-19 ENCOUNTER — Encounter (INDEPENDENT_AMBULATORY_CARE_PROVIDER_SITE_OTHER): Payer: Self-pay | Admitting: Gastroenterology

## 2024-07-03 ENCOUNTER — Ambulatory Visit: Admitting: Family Medicine

## 2024-07-03 ENCOUNTER — Encounter: Payer: Self-pay | Admitting: Family Medicine

## 2024-07-03 VITALS — BP 138/88 | HR 99 | Temp 98.4°F | Ht 68.0 in | Wt 218.0 lb

## 2024-07-03 DIAGNOSIS — R030 Elevated blood-pressure reading, without diagnosis of hypertension: Secondary | ICD-10-CM

## 2024-07-03 DIAGNOSIS — R7989 Other specified abnormal findings of blood chemistry: Secondary | ICD-10-CM | POA: Diagnosis not present

## 2024-07-03 DIAGNOSIS — Z0000 Encounter for general adult medical examination without abnormal findings: Secondary | ICD-10-CM

## 2024-07-03 DIAGNOSIS — Z0001 Encounter for general adult medical examination with abnormal findings: Secondary | ICD-10-CM

## 2024-07-03 DIAGNOSIS — E785 Hyperlipidemia, unspecified: Secondary | ICD-10-CM | POA: Diagnosis not present

## 2024-07-03 DIAGNOSIS — K760 Fatty (change of) liver, not elsewhere classified: Secondary | ICD-10-CM

## 2024-07-03 NOTE — Progress Notes (Signed)
" ° °  Subjective:    Patient ID: Mike Harvey, male    DOB: [birth year], 38 y.o.   MRN: [id number]  HPI  The patient comes in today for a wellness visit. eah cooking all this is a challenge yeah there is not a great solution for that   A review of their health history was completed.  A review of medications was also completed.  Any needed refills; no  Eating habits: fair  Falls/  MVA accidents in past few months: no  Regular exercise: no  Specialist pt sees on regular basis:   Preventative health issues were discussed.   Additional concerns: none   Review of Systems     Objective:   Physical Exam General-in no acute distress Eyes-no discharge Lungs-respiratory rate normal, CTA CV-no murmurs,RRR Extremities skin warm dry no edema Neuro grossly normal Behavior normal, alert        Assessment & Plan:  1. Adult wellness visit (Primary) Adult wellness-complete.wellness physical was conducted today. Importance of diet and exercise were discussed in detail.  Importance of stress reduction and healthy living were discussed.  In addition to this a discussion regarding safety was also covered.  We also reviewed over immunizations and gave recommendations regarding current immunization needed for age.   In addition to this additional areas were also touched on including: Preventative health exams needed:  Colonoscopy not indicated currently  Patient was advised yearly wellness exam  - Basic metabolic panel with GFR  2. Hyperlipidemia, unspecified hyperlipidemia type Healthy diet check labs - Lipid Panel  3. Fatty liver Healthy diet, portion control, exercise, try to lose weight, down the road GLP-1 may be of benefit once covered by insurance this could be a few years in the making - Hepatic function panel  4. Elevated LFTs See per above healthy diet - Hepatic function panel  5. Elevated blood pressure reading Healthy diet fit and walking on a regular basis  send blood pressure readings from home follow-up in 5 to 6 months to recheck blood pressure If pressures do not come down into a better range consideration for blood pressure medicine - Basic metabolic panel with GFR   "

## 2025-01-02 ENCOUNTER — Ambulatory Visit: Admitting: Family Medicine

## 2025-07-04 ENCOUNTER — Encounter: Admitting: Family Medicine
